# Patient Record
Sex: Male | Born: 1960
Health system: Southern US, Community
[De-identification: ages and names within clinical notes are randomized; demographics above are authoritative.]

## PROBLEM LIST (undated history)

## (undated) DIAGNOSIS — K635 Polyp of colon: Secondary | ICD-10-CM

## (undated) DIAGNOSIS — R7303 Prediabetes: Secondary | ICD-10-CM

## (undated) DIAGNOSIS — G4730 Sleep apnea, unspecified: Secondary | ICD-10-CM

## (undated) DIAGNOSIS — K760 Fatty (change of) liver, not elsewhere classified: Secondary | ICD-10-CM

## (undated) DIAGNOSIS — Z86010 Personal history of colonic polyps: Secondary | ICD-10-CM

## (undated) DIAGNOSIS — M1990 Unspecified osteoarthritis, unspecified site: Secondary | ICD-10-CM

## (undated) DIAGNOSIS — T8859XA Other complications of anesthesia, initial encounter: Secondary | ICD-10-CM

## (undated) HISTORY — PX: VASECTOMY: SHX75

## (undated) HISTORY — DX: Sleep apnea, unspecified: G47.30

## (undated) HISTORY — DX: Polyp of colon: K63.5

## (undated) HISTORY — DX: Fatty (change of) liver, not elsewhere classified: K76.0

## (undated) HISTORY — DX: Personal history of colonic polyps: Z86.010

---

## 1991-02-01 HISTORY — PX: TMJ ARTHROSCOPY: SHX1067

## 2010-02-17 ENCOUNTER — Encounter
Admission: RE | Admit: 2010-02-17 | Discharge: 2010-02-17 | Payer: Self-pay | Source: Home / Self Care | Attending: Internal Medicine | Admitting: Internal Medicine

## 2010-03-10 ENCOUNTER — Ambulatory Visit (HOSPITAL_BASED_OUTPATIENT_CLINIC_OR_DEPARTMENT_OTHER): Payer: BC Managed Care – PPO

## 2010-08-05 ENCOUNTER — Other Ambulatory Visit: Payer: Self-pay | Admitting: Internal Medicine

## 2010-08-05 DIAGNOSIS — M898X1 Other specified disorders of bone, shoulder: Secondary | ICD-10-CM

## 2010-08-06 ENCOUNTER — Ambulatory Visit
Admission: RE | Admit: 2010-08-06 | Discharge: 2010-08-06 | Disposition: A | Discharge status: Home / Self Care | Payer: BC Managed Care – PPO | Source: Ambulatory Visit | Attending: Internal Medicine | Admitting: Internal Medicine

## 2010-08-06 DIAGNOSIS — M898X1 Other specified disorders of bone, shoulder: Secondary | ICD-10-CM

## 2010-08-09 ENCOUNTER — Other Ambulatory Visit: Payer: Self-pay | Admitting: Internal Medicine

## 2010-08-09 DIAGNOSIS — K769 Liver disease, unspecified: Secondary | ICD-10-CM

## 2010-08-11 ENCOUNTER — Ambulatory Visit
Admission: RE | Admit: 2010-08-11 | Discharge: 2010-08-11 | Disposition: A | Discharge status: Home / Self Care | Payer: BC Managed Care – PPO | Source: Ambulatory Visit | Attending: Internal Medicine | Admitting: Internal Medicine

## 2010-08-11 DIAGNOSIS — K769 Liver disease, unspecified: Secondary | ICD-10-CM

## 2010-08-11 MED ORDER — IOHEXOL 300 MG/ML  SOLN
125.0000 mL | Freq: Once | INTRAMUSCULAR | Status: AC | PRN
  Administered 2010-08-11: 125 mL via INTRAVENOUS

## 2011-07-08 ENCOUNTER — Ambulatory Visit (INDEPENDENT_AMBULATORY_CARE_PROVIDER_SITE_OTHER): Payer: BC Managed Care – PPO | Admitting: Physician Assistant

## 2011-07-08 VITALS — BP 124/79 | HR 91 | Temp 98.9°F | Resp 16 | Ht 68.0 in | Wt 225.4 lb

## 2011-07-08 DIAGNOSIS — H9190 Unspecified hearing loss, unspecified ear: Secondary | ICD-10-CM

## 2011-07-08 DIAGNOSIS — H6990 Unspecified Eustachian tube disorder, unspecified ear: Secondary | ICD-10-CM

## 2011-07-08 DIAGNOSIS — H6120 Impacted cerumen, unspecified ear: Secondary | ICD-10-CM

## 2011-07-08 DIAGNOSIS — H6980 Other specified disorders of Eustachian tube, unspecified ear: Secondary | ICD-10-CM

## 2011-07-08 MED ORDER — PREDNISONE 20 MG PO TABS: ORAL_TABLET | ORAL | Status: AC

## 2011-07-08 NOTE — Progress Notes (Signed)
  Subjective:    Patient ID: Mitchell Lewis, male    DOB: May 28, 1960, 51 y.o.   MRN: [id number]  HPI Patient presents with progressively worsening hearing loss in both ears and tinnitus in the right ear. He states that he has a history of tinnitus in the right ear as well as cerumen impaction. He also complains of allergic rhinitis, ear pressure, and some sinus pressure. No fever, chills, otalgia, nausea, vomiting, or sore throat.     Review of Systems  All other systems reviewed and are negative.       Objective:   Physical Exam  Constitutional: He is oriented to person, place, and time. He appears well-developed and well-nourished.  HENT:  Head: Normocephalic and atraumatic.  Right Ear: External ear and ear canal normal.  Left Ear: External ear and ear canal normal. A middle ear effusion is present.  Mouth/Throat: Oropharynx is clear and moist. No oropharyngeal exudate.       Right cerumen impaction. Normal TM s/p ear irrigation. Left ear effusion present  Eyes: Conjunctivae are normal.  Neck: Neck supple.  Cardiovascular: Normal rate, regular rhythm and normal heart sounds.   Pulmonary/Chest: Effort normal and breath sounds normal.  Lymphadenopathy:    He has no cervical adenopathy.  Neurological: He is alert and oriented to person, place, and time.  Psychiatric: He has a normal mood and affect. His behavior is normal. Judgment and thought content normal.          Assessment & Plan:   1. Excessive ear wax  Ear care instructions provided.  EAR CERUMEN REMOVAL, EAR CERUMEN REMOVAL  2. Hearing loss  If no improvement after completion of steroid treatment, refer to ENT for evaluation (patient will wait to start treatment after colonoscopy scheduled for 07/15/11) Pure tone audiometry air only, Pure tone audiometry air only  3. Eustachian tube dysfunction  Recommend OTC claritin or zyrtec to help with effusion as well as allergic rhinitis.  predniSONE (DELTASONE) 20 MG tablet

## 2011-07-08 NOTE — Patient Instructions (Addendum)
Discontinue use of Q-tips, hair pins, keys, etc.   To help prevent cerumen impaction: While in the washing hair in the shower, allow soapy water to run into ear canal for a few minutes and then rinse (it dissolves the wax like a dishwasher dissolves grease).   May also use half hydrogen peroxide half water in the shower 2-3 times per week to help rinse out the wax. DO NOT USE 100% HYDROGEN PEROXIDE (this can burn the skin).   Also, several drops of sweet oil can be used to help prevent itching of the canal. If this is not enough to decrease itch, you may put a small amount of OTC hydrocortisone cream on pinky finger and apply to portion of canal that can be reached with tip of finger.  Recommend OTC Debrox or Colace to prevent impaction.  Serous Otitis Media  Serous otitis media is also known as otitis media with effusion (OME). It means there is fluid in the middle ear space. This space contains the bones for hearing and air. Air in the middle ear space helps to transmit sound.  The air gets there through the eustachian tube. This tube goes from the back of the throat to the middle ear space. It keeps the pressure in the middle ear the same as the outside world. It also helps to drain fluid from the middle ear space. CAUSES  OME occurs when the eustachian tube gets blocked. Blockage can come from:  Ear infections.   Colds and other upper respiratory infections.   Allergies.   Irritants such as cigarette smoke.   Sudden changes in air pressure (such as descending in an airplane).   Enlarged adenoids.  During colds and upper respiratory infections, the middle ear space can become temporarily filled with fluid. This can happen after an ear infection also. Once the infection clears, the fluid will generally drain out of the ear through the eustachian tube. If it does not, then OME occurs. SYMPTOMS   Hearing loss.   A feeling of fullness in the ear - but no pain.   Young children may not  show any symptoms.  DIAGNOSIS   Diagnosis of OME is made by an ear exam.   Tests may be done to check on the movement of the eardrum.   Hearing exams may be done.  TREATMENT   The fluid most often goes away without treatment.   If allergy is the cause, allergy treatment may be helpful.   Fluid that persists for several months may require minor surgery. A small tube is placed in the ear drum to:   Drain the fluid.   Restore the air in the middle ear space.   In certain situations, antibiotics are used to avoid surgery.   Surgery may be done to remove enlarged adenoids (if this is the cause).  HOME CARE INSTRUCTIONS   Keep children away from tobacco smoke.   Be sure to keep follow up appointments, if any.  SEEK MEDICAL CARE IF:   Hearing is not better in 3 months.   Hearing is worse.   Ear pain.   Drainage from the ear.   Dizziness.  Document Released: 04/09/2003 Document Revised: 01/06/2011 Document Reviewed: 02/07/2008 [facility] Patient Information 2012 [place], [place].

## 2011-11-06 IMAGING — CR DG LUMBAR SPINE COMPLETE 4+V
5 series · 5 of 5 positions shown · non-contrast
Comparison: None.

CLINICAL DATA: Low back pain with left leg radiculopathy.

LUMBAR SPINE - COMPLETE 4+ VIEW

[view not recorded (1 of 5)]
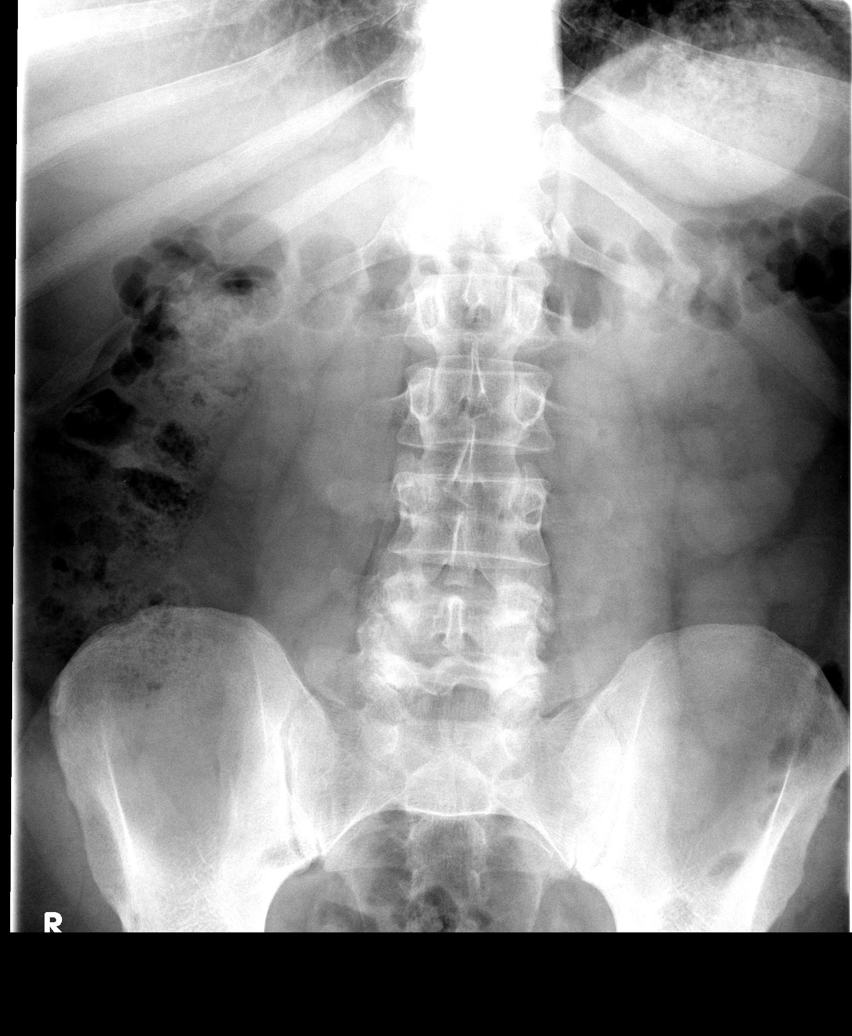

[view not recorded (2 of 5)]
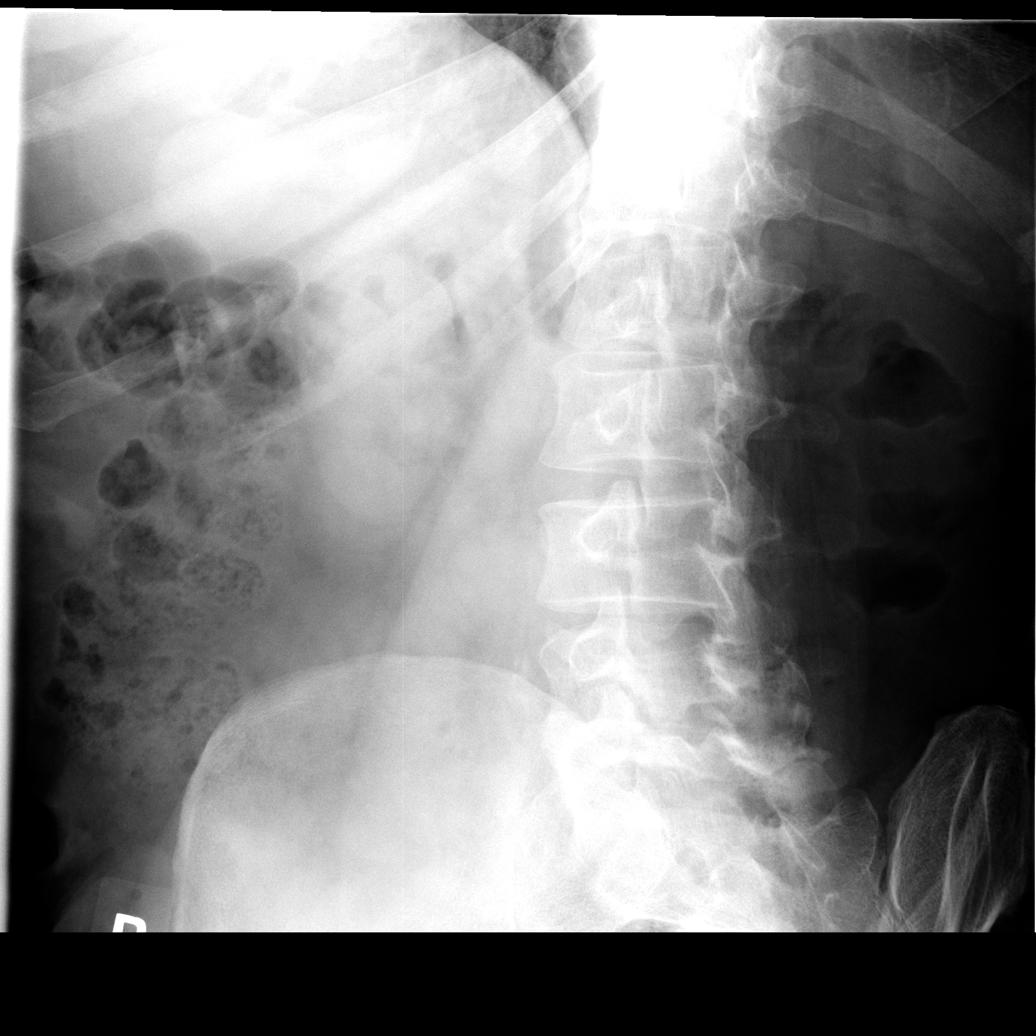

[view not recorded (3 of 5)]
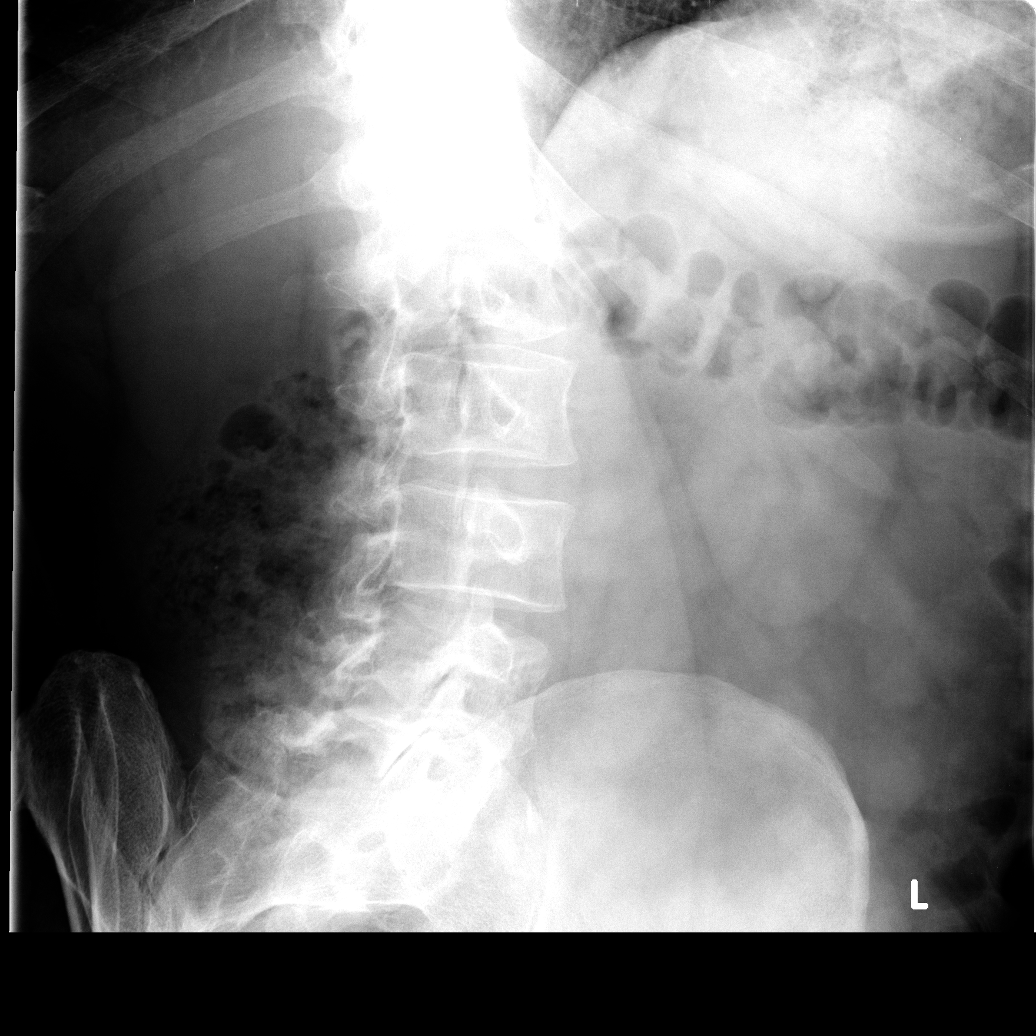

[view not recorded (4 of 5)]
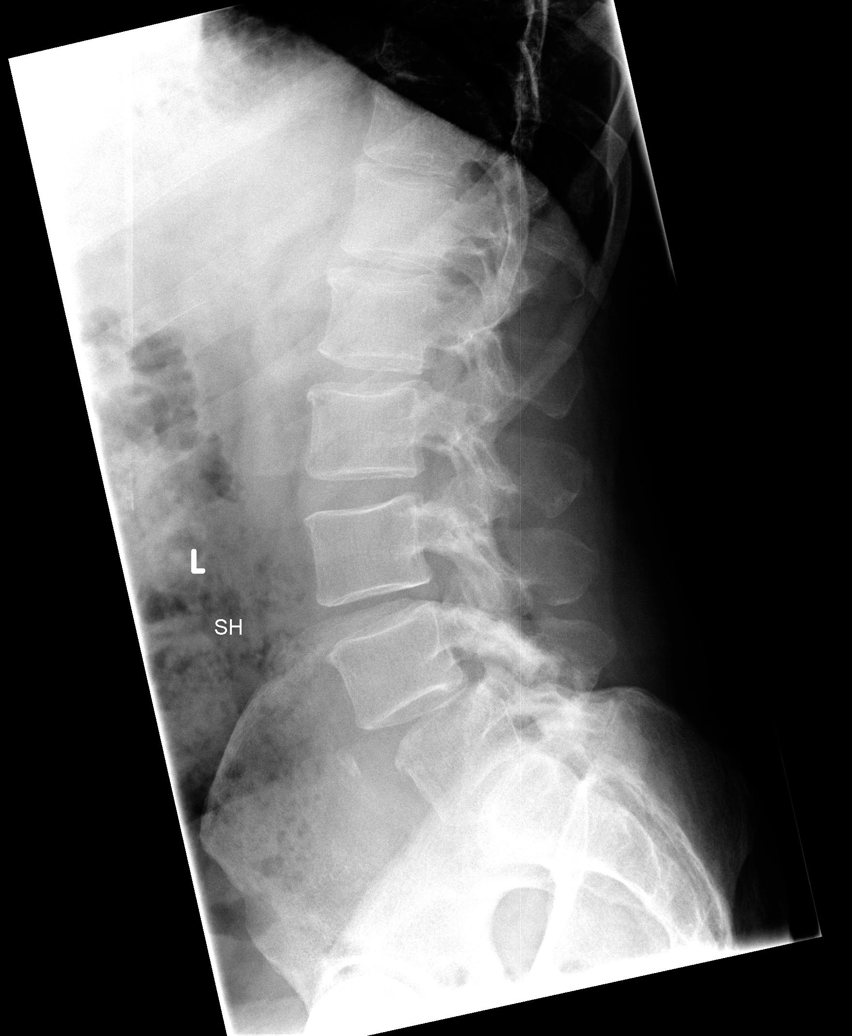

[view not recorded (5 of 5)]
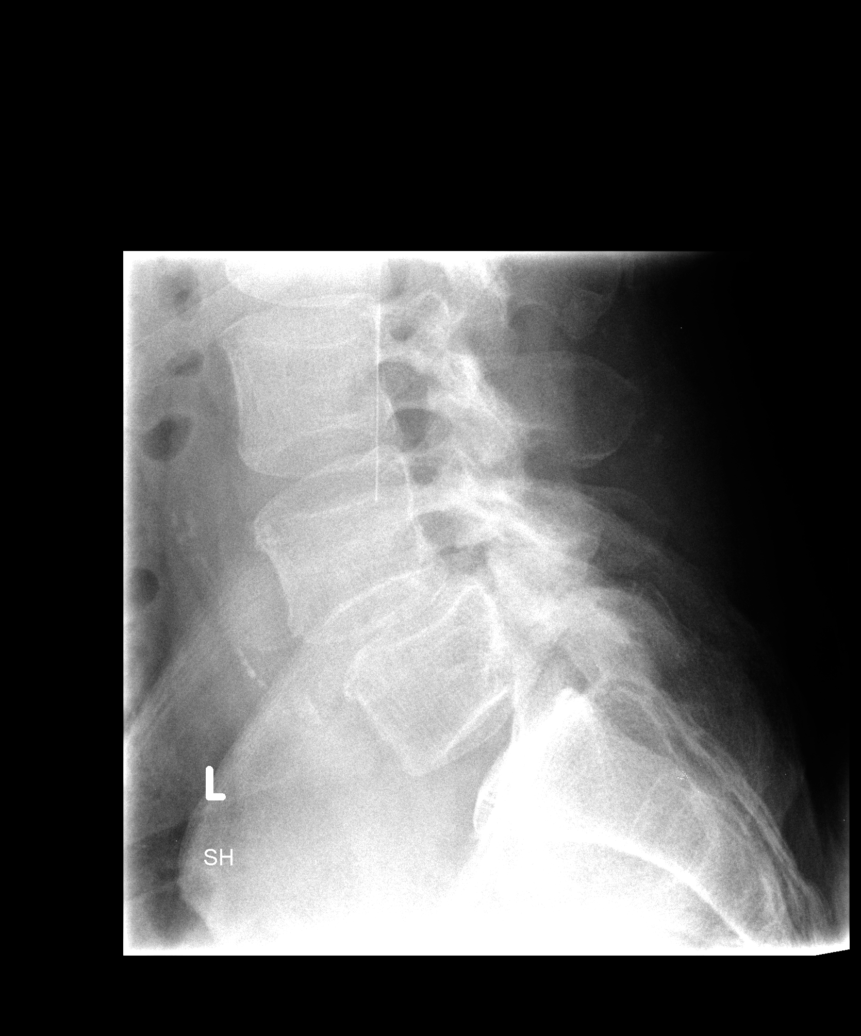

[5 of 5 positions shown; findings below may reference images not displayed]

FINDINGS: There is sclerosis of the L4 pars bilaterally.  An
associated lucency is difficult to confirm.  There is mild grade 1
anterolisthesis of L4 on L5.  There are mild endplate degenerative
changes scattered throughout the thoracolumbar spine.  Facet
hypertrophy in the lower lumbar spine.
IMPRESSION: 1.  Mild grade 1 anterolisthesis of L4 on L5.  Question chronic L5
pars defects.
2.  Spondylosis.

## 2012-01-18 ENCOUNTER — Ambulatory Visit
Admission: RE | Admit: 2012-01-18 | Discharge: 2012-01-18 | Disposition: A | Discharge status: Home / Self Care | Payer: BC Managed Care – PPO | Source: Ambulatory Visit | Attending: Nurse Practitioner | Admitting: Nurse Practitioner

## 2012-01-18 ENCOUNTER — Other Ambulatory Visit: Payer: Self-pay | Admitting: Nurse Practitioner

## 2012-01-18 DIAGNOSIS — R05 Cough: Secondary | ICD-10-CM

## 2012-01-18 DIAGNOSIS — R059 Cough, unspecified: Secondary | ICD-10-CM

## 2012-02-15 ENCOUNTER — Other Ambulatory Visit: Payer: Self-pay | Admitting: Nurse Practitioner

## 2012-02-15 ENCOUNTER — Ambulatory Visit
Admission: RE | Admit: 2012-02-15 | Discharge: 2012-02-15 | Disposition: A | Discharge status: Home / Self Care | Payer: BC Managed Care – PPO | Source: Ambulatory Visit | Attending: Nurse Practitioner | Admitting: Nurse Practitioner

## 2012-02-15 DIAGNOSIS — R05 Cough: Secondary | ICD-10-CM

## 2012-02-15 DIAGNOSIS — R52 Pain, unspecified: Secondary | ICD-10-CM

## 2012-02-15 DIAGNOSIS — R059 Cough, unspecified: Secondary | ICD-10-CM

## 2012-02-23 ENCOUNTER — Institutional Professional Consult (permissible substitution): Payer: BC Managed Care – PPO | Admitting: Internal Medicine

## 2012-04-24 IMAGING — US US ABDOMEN COMPLETE
1 series · 13 of 25 positions shown · non-contrast
Comparison: Abdominal radiographs - 02/17/2010

CLINICAL DATA: Scapular pain, evaluate for gallstones

ABDOMINAL ULTRASOUND COMPLETE

[Series 1: us abdomen complete · 0.32mm/px · 13 of 79 slices shown]
[im 1/79]
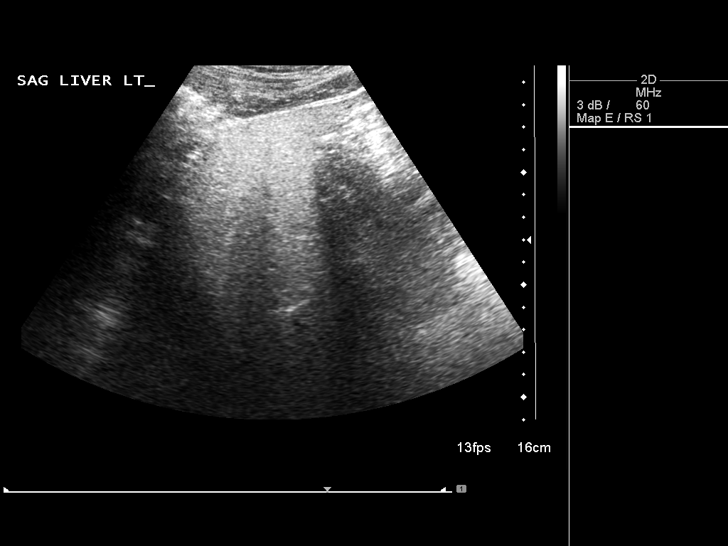
[im 7/79]
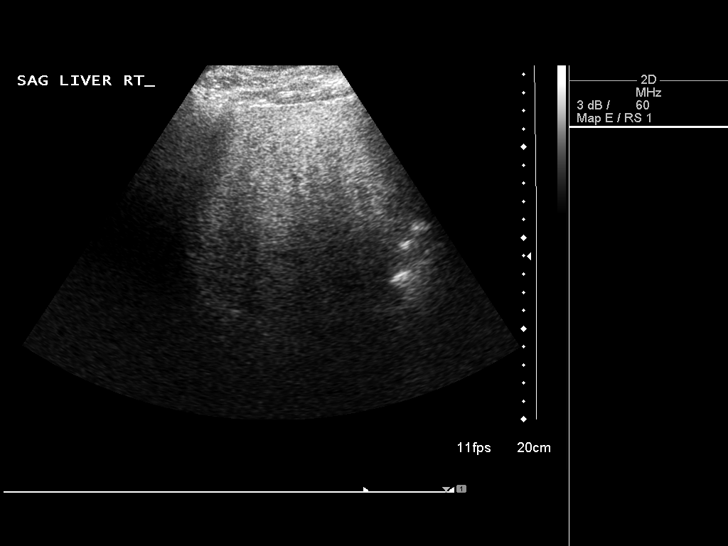
[im 14/79]
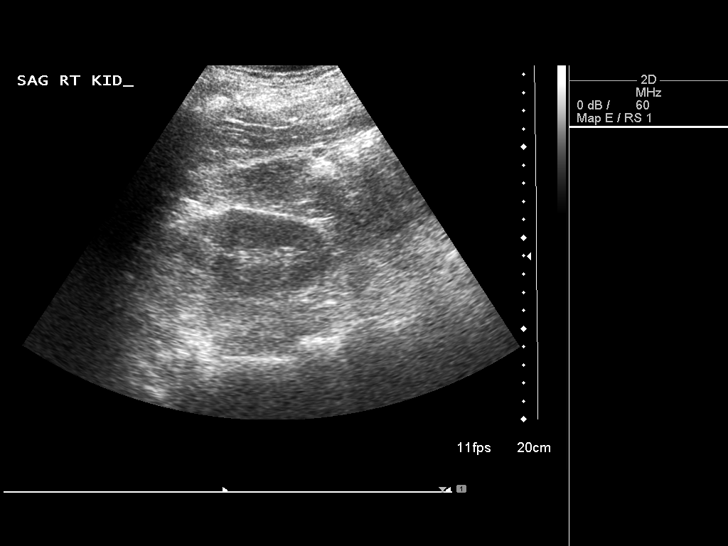
[im 20/79]
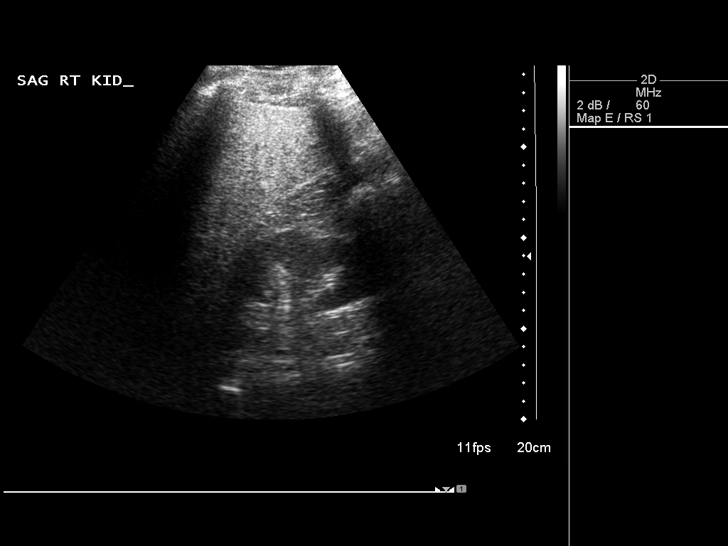
[im 27/79]
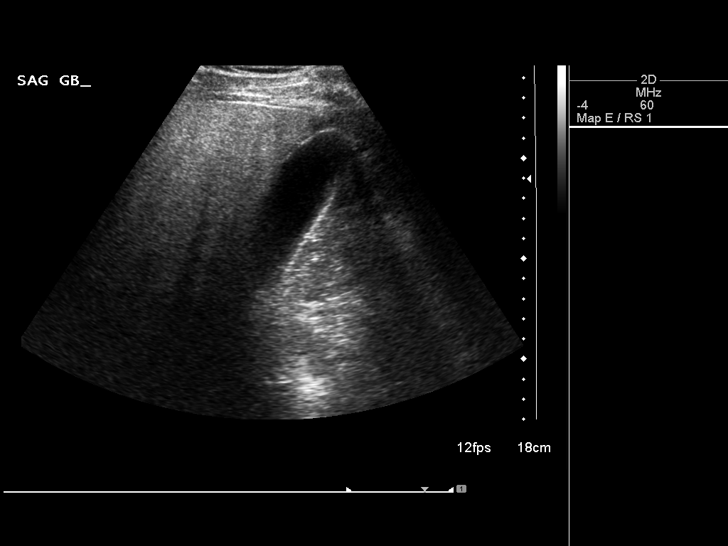
[im 33/79]
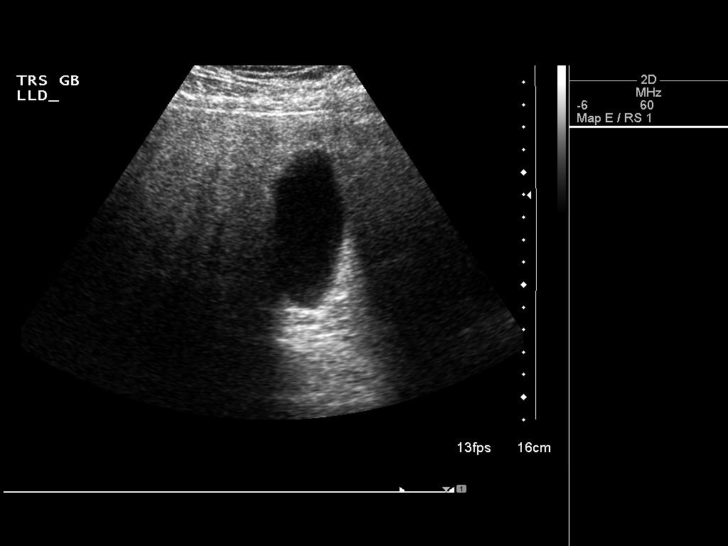
[im 40/79]
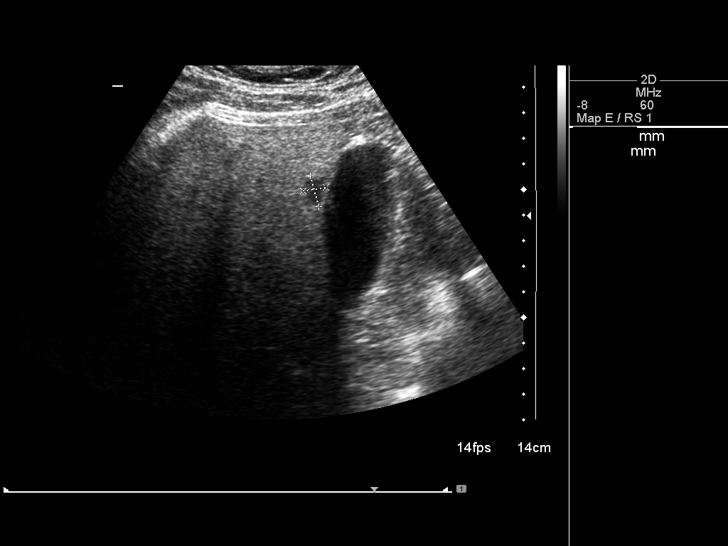
[im 46/79]
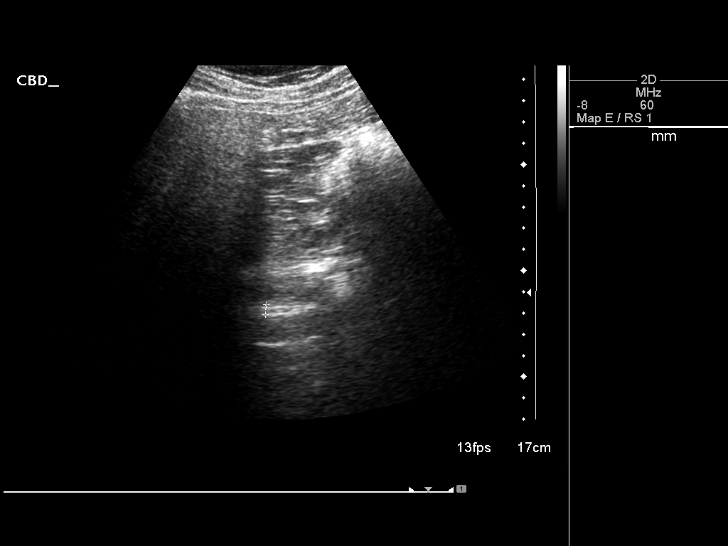
[im 53/79]
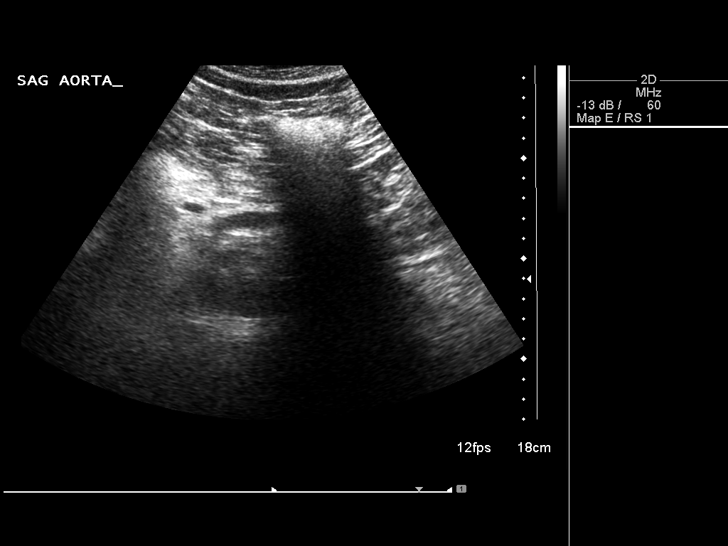
[im 59/79]
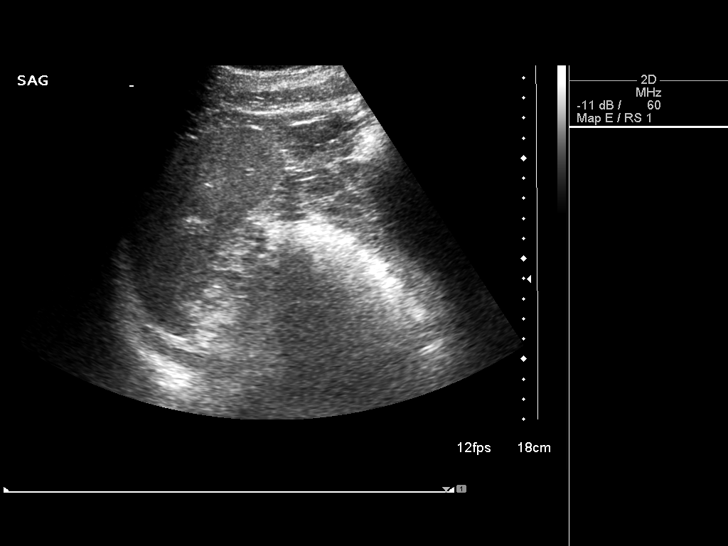
[im 66/79]
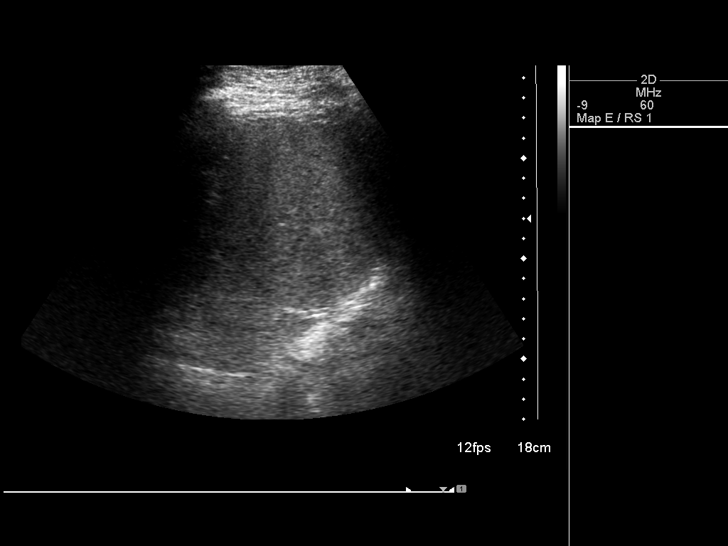
[im 72/79]
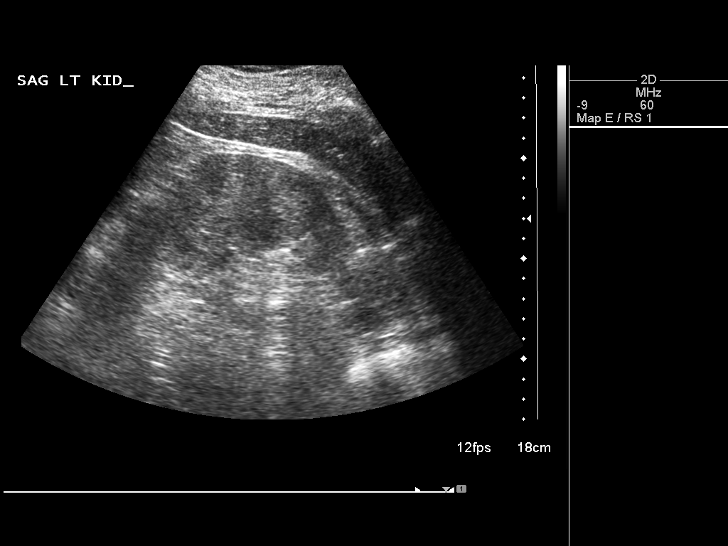
[im 79/79]
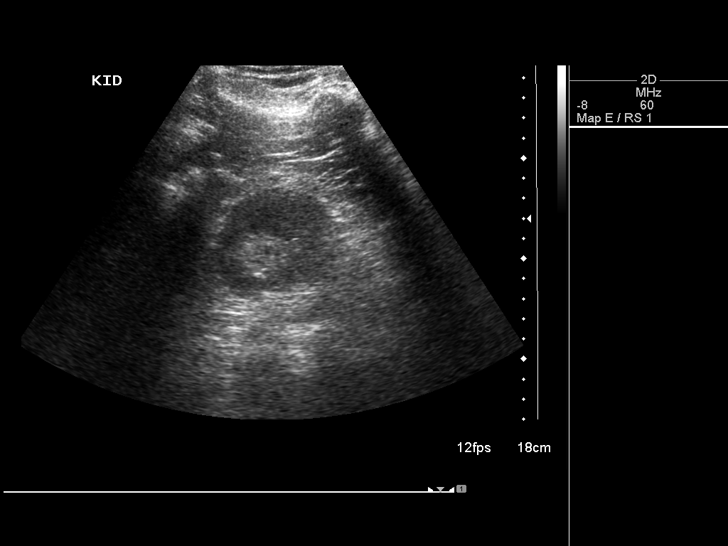

[13 of 25 positions shown; findings below may reference images not displayed]

FINDINGS: Gallbladder:  No gallstones, gallbladder wall thickening, or
pericholecystic fluid.

Common Bile Duct:  Normal in size, measuring 4.7 mm.

Liver: There are diffuse increased echogenicity of the hepatic
parenchyma, suggestive of hepatic steatosis.  There is an
approximately 1.3 x 1.2 x 0.9 cm hypoechoic mass adjacent to the
gallbladder fossa which does not demonstrate definite internal
blood flow, however cannot be characterized as a simple hepatic
cyst.

IVC:  Appears normal.

Pancreas:  Largely obscured by bowel gas.

Spleen:  Normal in size and echogenicity, measuring 9.8 cm in
length.

Right kidney:  The right kidney is normal in size, measuring
cm in length, cortical thickness and echogenicity.  No focal hyper
or hypoechoic renal lesions.  No echogenic renal stones.  No
urinary obstruction.

Left kidney:  The left kidney is normal in size, measuring 12.2 cm
in length, cortical thickness and echogenicity.  No focal hyper or
hypoechoic renal lesions.  No echogenic renal stones.  No urinary
obstruction.

Abdominal Aorta:  Nonaneurysmal.
IMPRESSION: 1. No evidence of cholelithiasis.

2. Hepatic steatosis.

3. Indeterminate 1.2 cm hypoechoic hepatic lesion adjacent to the
gallbladder fossa.  While this may represent an area of focal fatty
sparing, further evaluation may be obtained with an abdominal CT or
MRI. Alternatively, a follow-up abdominal ultrasound may be
performed in 3 to 6 months to insure stability.

## 2014-10-02 ENCOUNTER — Institutional Professional Consult (permissible substitution): Payer: Self-pay | Admitting: Neurology

## 2016-02-01 DIAGNOSIS — Z860100 Personal history of colon polyps, unspecified: Secondary | ICD-10-CM

## 2016-02-01 HISTORY — DX: Personal history of colonic polyps: Z86.010

## 2016-02-01 HISTORY — DX: Personal history of colon polyps, unspecified: Z86.0100

## 2017-01-09 HISTORY — PX: COLONOSCOPY: SHX174

## 2017-02-16 ENCOUNTER — Ambulatory Visit (INDEPENDENT_AMBULATORY_CARE_PROVIDER_SITE_OTHER): Payer: BLUE CROSS/BLUE SHIELD | Admitting: Family Medicine

## 2017-02-16 ENCOUNTER — Encounter: Payer: Self-pay | Admitting: Family Medicine

## 2017-02-16 ENCOUNTER — Other Ambulatory Visit: Payer: Self-pay

## 2017-02-16 VITALS — BP 128/90 | HR 91 | Temp 99.0°F | Resp 20 | Ht 68.0 in | Wt 292.2 lb

## 2017-02-16 DIAGNOSIS — B028 Zoster with other complications: Secondary | ICD-10-CM | POA: Diagnosis not present

## 2017-02-16 MED ORDER — VALACYCLOVIR HCL 1 G PO TABS: 1000.0000 mg | ORAL_TABLET | Freq: Three times a day (TID) | ORAL | 0 refills | Status: DC

## 2017-02-16 NOTE — Patient Instructions (Addendum)
You have an eye appointment today at [facility] at 2pm.  The address is:  [address] [place], [place] [address]     IF you received an x-ray today, you will receive an invoice from [facility]. Please contact [facility] at [phone] with questions or concerns regarding your invoice.   IF you received labwork today, you will receive an invoice from [place]. Please contact [facility] at [phone] with questions or concerns regarding your invoice.   Our billing staff will not be able to assist you with questions regarding bills from these companies.  You will be contacted with the lab results as soon as they are available. The fastest way to get your results is to activate your My Chart account. Instructions are located on the last page of this paperwork. If you have not heard from [place] regarding the results in 2 weeks, please contact this office.

## 2017-02-16 NOTE — Progress Notes (Signed)
   1/17/201912:30 PM  Mitchell Lewis 10-03-1960, 57 y.o. male [id number]  Chief Complaint  Patient presents with  . Rash    x3 days, left eye, pt states rash is starting to itch around the eye area and starting to blur his vision. Pt states it is not painful.    HPI:   Patient is a 57 y.o. male who presents today for 3 days of rash around left eyebrow. He states it started as a small collection of blisters with clear fluid. They have drained since then. Had some pressure in left ear but no burning pain. He states it is starting to itch. He is also starting to feel left eye pressure, blurry vision. He had chicken pox as a child.  Depression screen PHQ 2/9 02/16/2017  Decreased Interest 0  Down, Depressed, Hopeless 0  PHQ - 2 Score 0    Allergies  Allergen Reactions  . Sulfa Antibiotics Rash    Prior to Admission medications   Not on File    Past Medical History:  Diagnosis Date  . Colon polyp     Past Surgical History:  Procedure Laterality Date  . VASECTOMY      Social History   Tobacco Use  . Smoking status: Former Research scientist (life sciences)  . Smokeless tobacco: Never Used  Substance Use Topics  . Alcohol use: Yes    Alcohol/week: 0.6 oz    Types: 1 Standard drinks or equivalent per week    Family History  Problem Relation Age of Onset  . Diabetes Mother   . Cancer Father   . Heart disease Father     ROS  Per hpi   OBJECTIVE:  Blood pressure 128/90, pulse 91, temperature 99 F (37.2 C), temperature source Oral, resp. rate 20, height 5\' 8"  (1.727 m), weight 292 lb 3.2 oz (132.5 kg), SpO2 94 %.  Physical Exam Gen: AAOx3, NAD Skin: erythematous plaque non warm nor tender over left eyebrow covering down to upper left eyelid with a cluster of scabbed over vesicles in the middle. Ears: Left TM and canal are normal, no vesicles noted Eyes: EOMI and nonpainful, PERRLA, left conjunctiva mildly injected, negative fluorescin uptake   Visual Acuity Screening   Right eye  Left eye Both eyes  Without correction: 20/40 20/40 20/25   With correction:       ASSESSMENT and PLAN  1. Herpes zoster with complication Discussed supportive measures, new meds r/se/b and RTC precautions. Patient educational handout given. Patient has appt to be seen by ophtho today at 2pm, Dr Katy Fitch - Ambulatory referral to Ophthalmology  Other orders - valACYclovir (VALTREX) 1000 MG tablet; Take 1 tablet (1,000 mg total) by mouth 3 (three) times daily.  Return in about 1 week (around 02/23/2017).    Rutherford Guys, MD Primary Care at [facility] [place], [place] [address] Ph.  [phone] Fax [phone]

## 2017-02-21 ENCOUNTER — Telehealth: Payer: Self-pay | Admitting: Family Medicine

## 2017-02-21 NOTE — Telephone Encounter (Signed)
Sent to [facility] instead of [facility].

## 2017-03-01 ENCOUNTER — Encounter: Payer: Self-pay | Admitting: Family Medicine

## 2017-03-01 ENCOUNTER — Ambulatory Visit (INDEPENDENT_AMBULATORY_CARE_PROVIDER_SITE_OTHER): Payer: BLUE CROSS/BLUE SHIELD | Admitting: Family Medicine

## 2017-03-01 VITALS — BP 142/88 | HR 92 | Temp 98.6°F | Ht 67.75 in | Wt 298.4 lb

## 2017-03-01 DIAGNOSIS — R5382 Chronic fatigue, unspecified: Secondary | ICD-10-CM

## 2017-03-01 DIAGNOSIS — Z7689 Persons encountering health services in other specified circumstances: Secondary | ICD-10-CM | POA: Diagnosis not present

## 2017-03-01 DIAGNOSIS — Z125 Encounter for screening for malignant neoplasm of prostate: Secondary | ICD-10-CM

## 2017-03-01 DIAGNOSIS — M5442 Lumbago with sciatica, left side: Secondary | ICD-10-CM | POA: Diagnosis not present

## 2017-03-01 DIAGNOSIS — G4730 Sleep apnea, unspecified: Secondary | ICD-10-CM

## 2017-03-01 DIAGNOSIS — Z87898 Personal history of other specified conditions: Secondary | ICD-10-CM

## 2017-03-01 DIAGNOSIS — G8929 Other chronic pain: Secondary | ICD-10-CM

## 2017-03-01 DIAGNOSIS — Z6841 Body Mass Index (BMI) 40.0 and over, adult: Secondary | ICD-10-CM | POA: Diagnosis not present

## 2017-03-01 DIAGNOSIS — E66813 Obesity, class 3: Secondary | ICD-10-CM

## 2017-03-01 LAB — COMPREHENSIVE METABOLIC PANEL
ALBUMIN: 4.3 g/dL (ref 3.5–5.2)
ALT: 61 U/L — ABNORMAL HIGH (ref 0–53)
AST: 38 U/L — ABNORMAL HIGH (ref 0–37)
Alkaline Phosphatase: 79 U/L (ref 39–117)
BUN: 17 mg/dL (ref 6–23)
CALCIUM: 9.3 mg/dL (ref 8.4–10.5)
CHLORIDE: 103 meq/L (ref 96–112)
CO2: 30 meq/L (ref 19–32)
CREATININE: 1.05 mg/dL (ref 0.40–1.50)
GFR: 76.95 mL/min (ref >60.00)
Glucose, Bld: 116 mg/dL — ABNORMAL HIGH (ref 70–99)
POTASSIUM: 4.8 meq/L (ref 3.5–5.1)
Sodium: 138 mEq/L (ref 135–145)
Total Bilirubin: 0.9 mg/dL (ref 0.2–1.2)
Total Protein: 7.4 g/dL (ref 6.0–8.3)

## 2017-03-01 LAB — HEMOGLOBIN A1C: HEMOGLOBIN A1C: 6.3 % (ref 4.6–6.5)

## 2017-03-01 LAB — CBC WITH DIFFERENTIAL/PLATELET
Basophils Absolute: 0.1 10*3/uL (ref 0.0–0.1)
Basophils Relative: 1.2 % (ref 0.0–3.0)
EOS PCT: 3.1 % (ref 0.0–5.0)
Eosinophils Absolute: 0.2 10*3/uL (ref 0.0–0.7)
HEMATOCRIT: 49.5 % (ref 39.0–52.0)
HEMOGLOBIN: 17.1 g/dL — AB (ref 13.0–17.0)
LYMPHS PCT: 34.6 % (ref 12.0–46.0)
Lymphs Abs: 2.7 10*3/uL (ref 0.7–4.0)
MCHC: 34.6 g/dL (ref 30.0–36.0)
MCV: 96.5 fl (ref 78.0–100.0)
Monocytes Absolute: 1 10*3/uL (ref 0.1–1.0)
Monocytes Relative: 12.3 % — ABNORMAL HIGH (ref 3.0–12.0)
NEUTROS ABS: 3.9 10*3/uL (ref 1.4–7.7)
Neutrophils Relative %: 48.8 % (ref 43.0–77.0)
PLATELETS: 253 10*3/uL (ref 150.0–400.0)
RBC: 5.13 Mil/uL (ref 4.22–5.81)
RDW: 13.7 % (ref 11.5–15.5)
WBC: 7.9 10*3/uL (ref 4.0–10.5)

## 2017-03-01 LAB — PSA: PSA: 2.09 ng/mL (ref 0.10–4.00)

## 2017-03-01 LAB — TESTOSTERONE: TESTOSTERONE: 169.3 ng/dL — AB (ref 300.00–890.00)

## 2017-03-01 MED ORDER — MELOXICAM 15 MG PO TABS: 15.0000 mg | ORAL_TABLET | Freq: Every day | ORAL | 1 refills | Status: DC | PRN

## 2017-03-01 NOTE — Progress Notes (Addendum)
Subjective:    Patient ID: Mitchell Lewis, male    DOB: 06/25/1960, 57 y.o.   MRN: [id number]  HPI This is a 57 yo male who presents today to establish care. Is accompanied by his wife. Was seeing Delia Chimes in [place]- her office closed and he is unable to get his records.  Works as Barista. Enjoys fishing, hunting, has dogs x 3. Got married last year to his HS sweetheart. Has two sons.   Last CPE- about a year ago PSA- thinks about 1 year ago.  Colonoscopy- Dr. Maurice Small, has frequent colonoscopies due to multiple polyps, next due in 2 years.  Tdap- unsure Flu- never Dental- not regular Eye- annual, had recently Exercise- walks a lot, works outdoors every day  Obesity- Weight has gone from 175- to 298. Has symptoms of OSA, was unable to get testing for OSA due to insurance not covering.  Food recall-  Breakfast- 2 eggs, breakfast meat (1 piece bacon or sausage) Lunch- tuna salad, small amount of pasta salad with vegetables. Dinner- small amount lean protein, steamed veggies, sweet potato  No soda, no sweet tea, rare juice.   Fatigue- Has no energy. Is able to perform at work, then comes home exhausted. Currently not working much because this is his slow time of year.   Cardiology- Has had multiple stress tests (nuclear and stress), extensive work up Dr. Einar Gip. Normal per patient.   Hypogonadism- Had low testosterone levels and had testosterone injections for about 4 months without improvement. Has not seen urology or endocrine.   Low back pain- long lasting, across low back, pain/numbness down legs, worse as he walks more, stiff in the morning. No weakness. No bowel or bladder incontinence.   Past Medical History:  Diagnosis Date  . Colon polyp   . Fatty liver    Past Surgical History:  Procedure Laterality Date  . COLONOSCOPY  01/09/2017  . TMJ ARTHROSCOPY    . VASECTOMY     Family History  Problem Relation Age of Onset  . Diabetes Mother     . Cancer Father   . Heart disease Father    Social History   Tobacco Use  . Smoking status: Former Smoker    Packs/day: 1.00    Years: 40.00    Pack years: 40.00    Types: Cigarettes    Last attempt to quit: 05/15/2013    Years since quitting: 3.7  . Smokeless tobacco: Never Used  Substance Use Topics  . Alcohol use: Yes    Alcohol/week: 0.6 oz    Types: 1 Standard drinks or equivalent per week  . Drug use: No      Review of Systems  Constitutional: Positive for fatigue and unexpected weight change (increased). Negative for fever.  Eyes: Negative.   Respiratory: Positive for shortness of breath (gets winded with exertion since weight gain). Negative for cough, chest tightness and wheezing.   Cardiovascular: Negative for chest pain, palpitations and leg swelling.  Gastrointestinal: Negative for abdominal pain, constipation, diarrhea, nausea and vomiting.  Endocrine: Negative.   Genitourinary:       Stream not as strong as when he was younger.   Musculoskeletal: Positive for back pain.  Psychiatric/Behavioral: Positive for sleep disturbance (snoring, witnessed apnea).       Objective:   Physical Exam Physical Exam  Constitutional: Oriented to person, place, and time. He appears well-developed and well-nourished. Obese.  HENT:  Head: Normocephalic and atraumatic.  Eyes: Conjunctivae are normal.  Neck:  Normal range of motion. Neck supple.  Cardiovascular: Normal rate, regular rhythm and normal heart sounds.   Pulmonary/Chest: Effort normal and breath sounds normal.  Neurological: Alert and oriented to person, place, and time.  Skin: Skin is warm and dry.  Psychiatric: Normal mood and affect. Behavior is normal. Judgment and thought content normal.  Vitals reviewed.  BP (!) 142/88 (BP Location: Left Arm, Patient Position: Sitting, Cuff Size: Large)   Pulse 92   Temp 98.6 F (37 C) (Oral)   Ht 5' 7.75" (1.721 m)   Wt 298 lb 6.4 oz (135.4 kg)   SpO2 96%   BMI  45.71 kg/m      Depression screen [facility] 2/9 03/01/2017 02/16/2017  Decreased Interest 1 0  Down, Depressed, Hopeless 1 0  PHQ - 2 Score 2 0  Altered sleeping 2 -  Tired, decreased energy 2 -  Change in appetite 1 -  Feeling bad or failure about yourself  0 -  Trouble concentrating 0 -  Moving slowly or fidgety/restless 0 -  Suicidal thoughts 0 -  PHQ-9 Score 7 -  Difficult doing work/chores Somewhat difficult -    Assessment & Plan:  1. Encounter to establish care - will get available records - follow up in 4-8 weeks for CPE  2. BMI 45.0-49.9, adult (HCC) - CBC with Differential - Comprehensive metabolic panel - Hemoglobin A1c  3. Chronic bilateral low back pain with left-sided sciatica - Provided written and verbal information regarding diagnosis and treatment. - provided exercises and encouraged him to do daily - meloxicam (MOBIC) 15 MG tablet; Take 1 tablet (15 mg total) by mouth daily as needed for pain.  Dispense: 30 tablet; Refill: 1  4. Chronic fatigue - CBC with Differential - Comprehensive metabolic panel - Testosterone  5. History of snoring - Ambulatory referral to Pulmonology  6. Sleep apnea, unspecified type - witnessed episodes of apnea - Ambulatory referral to Pulmonology  7. Screening for prostate cancer - PSA   Clarene Reamer, FNP-BC  [facility]  03/01/2017 12:49 PM

## 2017-03-01 NOTE — Patient Instructions (Signed)
It was a pleasure to meet you today! I look forward to partnering with you for your health care needs  Please schedule your complete physical for 4-8 weeks   Back Exercises The following exercises strengthen the muscles that help to support the back. They also help to keep the lower back flexible. Doing these exercises can help to prevent back pain or lessen existing pain. If you have back pain or discomfort, try doing these exercises 2-3 times each day or as told by your health care provider. When the pain goes away, do them once each day, but increase the number of times that you repeat the steps for each exercise (do more repetitions). If you do not have back pain or discomfort, do these exercises once each day or as told by your health care provider. Exercises Single Knee to Chest  Repeat these steps 3-5 times for each leg: 1. Lie on your back on a firm bed or the floor with your legs extended. 2. Bring one knee to your chest. Your other leg should stay extended and in contact with the floor. 3. Hold your knee in place by grabbing your knee or thigh. 4. Pull on your knee until you feel a gentle stretch in your lower back. 5. Hold the stretch for 10-30 seconds. 6. Slowly release and straighten your leg.  Pelvic Tilt  Repeat these steps 5-10 times: 1. Lie on your back on a firm bed or the floor with your legs extended. 2. Bend your knees so they are pointing toward the ceiling and your feet are flat on the floor. 3. Tighten your lower abdominal muscles to press your lower back against the floor. This motion will tilt your pelvis so your tailbone points up toward the ceiling instead of pointing to your feet or the floor. 4. With gentle tension and even breathing, hold this position for 5-10 seconds.  Cat-Cow  Repeat these steps until your lower back becomes more flexible: 1. Get into a hands-and-knees position on a firm surface. Keep your hands under your shoulders, and keep your knees  under your hips. You may place padding under your knees for comfort. 2. Let your head hang down, and point your tailbone toward the floor so your lower back becomes rounded like the back of a cat. 3. Hold this position for 5 seconds. 4. Slowly lift your head and point your tailbone up toward the ceiling so your back forms a sagging arch like the back of a cow. 5. Hold this position for 5 seconds.  Press-Ups  Repeat these steps 5-10 times: 1. Lie on your abdomen (face-down) on the floor. 2. Place your palms near your head, about shoulder-width apart. 3. While you keep your back as relaxed as possible and keep your hips on the floor, slowly straighten your arms to raise the top half of your body and lift your shoulders. Do not use your back muscles to raise your upper torso. You may adjust the placement of your hands to make yourself more comfortable. 4. Hold this position for 5 seconds while you keep your back relaxed. 5. Slowly return to lying flat on the floor.  Bridges  Repeat these steps 10 times: 1. Lie on your back on a firm surface. 2. Bend your knees so they are pointing toward the ceiling and your feet are flat on the floor. 3. Tighten your buttocks muscles and lift your buttocks off of the floor until your waist is at almost the same height as  your knees. You should feel the muscles working in your buttocks and the back of your thighs. If you do not feel these muscles, slide your feet 1-2 inches farther away from your buttocks. 4. Hold this position for 3-5 seconds. 5. Slowly lower your hips to the starting position, and allow your buttocks muscles to relax completely.  If this exercise is too easy, try doing it with your arms crossed over your chest. Abdominal Crunches  Repeat these steps 5-10 times: 1. Lie on your back on a firm bed or the floor with your legs extended. 2. Bend your knees so they are pointing toward the ceiling and your feet are flat on the floor. 3. Cross  your arms over your chest. 4. Tip your chin slightly toward your chest without bending your neck. 5. Tighten your abdominal muscles and slowly raise your trunk (torso) high enough to lift your shoulder blades a tiny bit off of the floor. Avoid raising your torso higher than that, because it can put too much stress on your low back and it does not help to strengthen your abdominal muscles. 6. Slowly return to your starting position.  Back Lifts Repeat these steps 5-10 times: 1. Lie on your abdomen (face-down) with your arms at your sides, and rest your forehead on the floor. 2. Tighten the muscles in your legs and your buttocks. 3. Slowly lift your chest off of the floor while you keep your hips pressed to the floor. Keep the back of your head in line with the curve in your back. Your eyes should be looking at the floor. 4. Hold this position for 3-5 seconds. 5. Slowly return to your starting position.  Contact a health care provider if:  Your back pain or discomfort gets much worse when you do an exercise.  Your back pain or discomfort does not lessen within 2 hours after you exercise. If you have any of these problems, stop doing these exercises right away. Do not do them again unless your health care provider says that you can. Get help right away if:  You develop sudden, severe back pain. If this happens, stop doing the exercises right away. Do not do them again unless your health care provider says that you can. This information is not intended to replace advice given to you by your health care provider. Make sure you discuss any questions you have with your health care provider. Document Released: 02/25/2004 Document Revised: 05/27/2015 Document Reviewed: 03/13/2014 Elsevier Interactive Patient Education  2017 [employer].

## 2017-03-02 ENCOUNTER — Encounter: Payer: Self-pay | Admitting: Internal Medicine

## 2017-03-02 ENCOUNTER — Other Ambulatory Visit: Payer: Self-pay | Admitting: Family Medicine

## 2017-03-02 ENCOUNTER — Ambulatory Visit (INDEPENDENT_AMBULATORY_CARE_PROVIDER_SITE_OTHER): Payer: BLUE CROSS/BLUE SHIELD | Admitting: Internal Medicine

## 2017-03-02 VITALS — BP 138/88 | HR 75 | Resp 16 | Ht 67.5 in | Wt 297.0 lb

## 2017-03-02 DIAGNOSIS — G4719 Other hypersomnia: Secondary | ICD-10-CM | POA: Diagnosis not present

## 2017-03-02 DIAGNOSIS — R7989 Other specified abnormal findings of blood chemistry: Secondary | ICD-10-CM

## 2017-03-02 NOTE — Patient Instructions (Signed)
--

## 2017-03-02 NOTE — Progress Notes (Signed)
[facility] Consultation      Assessment and Plan:  Excessive daytime sleepiness.       Date: 03/02/2017  MRN# [id number] Mitchell Lewis 04/25/1960  Referring Physician:   [name] is a 57 y.o. old male seen in consultation for chief complaint of:    Chief Complaint  Patient presents with  . Advice Only    referred by Dr. Carlean Purl for evaluation of sleep apnea.  . Snoring    spouse reports apnea.    HPI:   Patient is a 57 year old male with a complaint of excessive daytime sleepiness.  The patient typically goes to bed between 8 and 10 PM, wakes up frequently during the night, gets out of bed at 5 AM.  His Epworth scale is elevated today at 15. His wife is present and provides some of the history. He has been snoring, for about 3 years  Since he gained about 130 pounds over 3 years, he quit smoking around that time.  He stops breathing in his sleep, his wife wakes him because he is not breathing and it is making you scared.  Lewis sleep walking, Lewis cataplexy. He does fall asleep easily when he is sitting, especially after lunch.  He wakes himself up several times.    PMHX:   Past Medical History:  Diagnosis Date  . Colon polyp   . Fatty liver    Surgical Hx:  Past Surgical History:  Procedure Laterality Date  . COLONOSCOPY  01/09/2017  . TMJ ARTHROSCOPY    . VASECTOMY     Family Hx:  Family History  Problem Relation Age of Onset  . Diabetes Mother   . Cancer Father   . Heart disease Father    Social Hx:   Social History   Tobacco Use  . Smoking status: Former Smoker    Packs/day: 1.00    Years: 40.00    Pack years: 40.00    Types: Cigarettes    Last attempt to quit: 05/15/2013    Years since quitting: 3.8  . Smokeless tobacco: Never Used  Substance Use Topics  . Alcohol use: Yes    Alcohol/week: 0.6 oz    Types: 1 Standard drinks or equivalent per week  . Drug use: Lewis   Medication:    Current Outpatient Medications:  .   meloxicam (MOBIC) 15 MG tablet, Take 1 tablet (15 mg total) by mouth daily as needed for pain., Disp: 30 tablet, Rfl: 1 .  TURMERIC PO, Take 1 tablet by mouth daily., Disp: , Rfl:  .  UNABLE TO FIND, Med Name: Super Beads, Disp: , Rfl:    Allergies:  Sulfa antibiotics  Review of Systems: Gen:  Denies  fever, sweats, chills HEENT: Denies blurred vision, double vision. bleeds, sore throat Cvc:  Lewis dizziness, chest pain. Resp:   Denies cough or sputum production, shortness of breath Gi: Denies swallowing difficulty, stomach pain. Gu:  Denies bladder incontinence, burning urine Ext:   Lewis Joint pain, stiffness. Skin: Lewis skin rash,  hives  Endoc:  Lewis polyuria, polydipsia. Psych: Lewis depression, insomnia. Other:  All other systems were reviewed with the patient and were negative other that what is mentioned in the HPI.   Physical Examination:   VS: BP 138/88 (BP Location: Left Arm, Cuff Size: Large)   Pulse 75   Resp 16   Ht 5' 7.5" (1.715 m)   Wt 297 lb (134.7 kg)   SpO2 94%   BMI 45.83 kg/m  General Appearance: Lewis distress  Neuro:without focal findings,  speech normal,  HEENT: PERRLA, EOM intact.   Pulmonary: normal breath sounds, Lewis wheezing.  CardiovascularNormal S1,S2.  Lewis m/r/g.   Abdomen: Benign, Soft, non-tender. Renal:  Lewis costovertebral tenderness  GU:  Lewis performed at this time. Endoc: Lewis evident thyromegaly, Lewis signs of acromegaly. Skin:   warm, Lewis rashes, Lewis ecchymosis  Extremities: normal, Lewis cyanosis, clubbing.  Other findings:    LABORATORY PANEL:   CBC Recent Labs  Lab 03/01/17 1001  WBC 7.9  HGB 17.1*  HCT 49.5  PLT 253.0   ------------------------------------------------------------------------------------------------------------------  Chemistries  Recent Labs  Lab 03/01/17 1001  NA 138  K 4.8  CL 103  CO2 30  GLUCOSE 116*  BUN 17  CREATININE 1.05  CALCIUM 9.3  AST 38*  ALT 61*  ALKPHOS 79  BILITOT 0.9    ------------------------------------------------------------------------------------------------------------------  Cardiac Enzymes Lewis results for input(s): TROPONINI in the last 168 hours. ------------------------------------------------------------  RADIOLOGY:  Lewis results found.     Thank  you for the consultation and for allowing [facility] to assist in the care of your patient. Our recommendations are noted above.  Please contact us if we can be of further service.   Marda Stalker, MD.  Board Certified in Internal Medicine, Pulmonary Medicine, [place], and Sleep Medicine.  [facility] Office Number: [phone]  Patricia Pesa, M.D.  Merton Border, M.D  03/02/2017

## 2017-03-03 ENCOUNTER — Institutional Professional Consult (permissible substitution): Payer: BLUE CROSS/BLUE SHIELD | Admitting: Internal Medicine

## 2017-03-09 ENCOUNTER — Encounter: Payer: Self-pay | Admitting: Family Medicine

## 2017-03-17 HISTORY — PX: WISDOM TOOTH EXTRACTION: SHX21

## 2017-03-20 ENCOUNTER — Telehealth: Payer: Self-pay | Admitting: Internal Medicine

## 2017-03-20 NOTE — Telephone Encounter (Signed)
Pt is due to go out of town on Sunday 03/26/17.  Offered 03/24/17 but patient is having a dental procedure on Wednesday 03/22/17.  Have left a message to see if I can get patient scheduled for 03/21/17 to change appointments with this patient and can get him scheduled for 03/21/17.  Have attempted to contact patient back to advise what I was attempting to arrange, however, no answer and no voice mail has been set up.  Rhonda J Cobb

## 2017-03-20 NOTE — Telephone Encounter (Signed)
Please call regarding pt sleep study , to be scheduled

## 2017-03-21 ENCOUNTER — Encounter: Payer: Self-pay | Admitting: Internal Medicine

## 2017-03-21 DIAGNOSIS — G4733 Obstructive sleep apnea (adult) (pediatric): Secondary | ICD-10-CM | POA: Diagnosis not present

## 2017-03-21 DIAGNOSIS — G4719 Other hypersomnia: Secondary | ICD-10-CM

## 2017-03-21 NOTE — Telephone Encounter (Signed)
Pt is coming to p/u the HST device Tues 03/21/17 between 3:30-4:30.  Pt is aware of this appointment and will be able to have study before any procedure. Nothing else needed at this time. Rhonda J Cobb

## 2017-03-22 ENCOUNTER — Telehealth: Payer: Self-pay | Admitting: *Deleted

## 2017-03-22 DIAGNOSIS — G4733 Obstructive sleep apnea (adult) (pediatric): Secondary | ICD-10-CM | POA: Diagnosis not present

## 2017-03-22 NOTE — Telephone Encounter (Signed)
Called patient to give results he has had dental surgery therefore spouse put him on speaker. Patient was given results of HST and orders for titration study have been placed. Nothing further needed.

## 2017-03-27 ENCOUNTER — Ambulatory Visit (INDEPENDENT_AMBULATORY_CARE_PROVIDER_SITE_OTHER): Payer: BLUE CROSS/BLUE SHIELD | Admitting: Urology

## 2017-03-27 ENCOUNTER — Encounter: Payer: Self-pay | Admitting: Urology

## 2017-03-27 VITALS — BP 154/91 | HR 67 | Ht 67.0 in | Wt 289.0 lb

## 2017-03-27 DIAGNOSIS — R351 Nocturia: Secondary | ICD-10-CM | POA: Diagnosis not present

## 2017-03-27 DIAGNOSIS — N401 Enlarged prostate with lower urinary tract symptoms: Secondary | ICD-10-CM | POA: Insufficient documentation

## 2017-03-27 DIAGNOSIS — E291 Testicular hypofunction: Secondary | ICD-10-CM | POA: Insufficient documentation

## 2017-03-27 DIAGNOSIS — R350 Frequency of micturition: Secondary | ICD-10-CM | POA: Diagnosis not present

## 2017-03-27 LAB — URINALYSIS, COMPLETE
BILIRUBIN UA: NEGATIVE
Glucose, UA: NEGATIVE
LEUKOCYTES UA: NEGATIVE
Nitrite, UA: NEGATIVE
PROTEIN UA: NEGATIVE
RBC UA: NEGATIVE
SPEC GRAV UA: 1.025 (ref 1.005–1.030)
Urobilinogen, Ur: 1 mg/dL (ref 0.2–1.0)
pH, UA: 6 (ref 5.0–7.5)

## 2017-03-27 LAB — MICROSCOPIC EXAMINATION
RBC, UA: NONE SEEN /hpf (ref 0–?)
WBC, UA: NONE SEEN /hpf (ref 0–?)

## 2017-03-27 LAB — BLADDER SCAN AMB NON-IMAGING: Scan Result: 37

## 2017-03-27 MED ORDER — TAMSULOSIN HCL 0.4 MG PO CAPS: 0.4000 mg | ORAL_CAPSULE | Freq: Every day | ORAL | 0 refills | Status: DC

## 2017-03-27 NOTE — Progress Notes (Signed)
03/27/2017 10:07 AM   Mitchell Lewis 1960/08/18 [id number]  Referring provider: Elby Beck, [place], [place] [address]  Chief Complaint  Patient presents with  . Urinary Frequency    HPI: Mitchell Lewis is a 57 year old male seen in consultation at the request of Mitchell Lewis for evaluation of hypogonadism.  He was seen by his PCP in January 2019 to establish care and related to chronic fatigue and tiredness.  He does have suspected sleep apnea and is awaiting an appointment by pulmonary.  A testosterone level was checked which was low at 169 ng/dL. A PSA was 2.09.  He denies decreased libido  or erectile dysfunction.  He states his symptoms of tiredness and fatigue started after a significant weight gain approximately 1-1/2 years ago.  His wife states this was when he also began to snore with apparent apneic episodes.  He also complains of bothersome nocturia at 3 times per night.  He also has urinary frequency with occasional urgency and and intermittent urinary stream with occasional straining to urinate.  He has been seen by Dr. Terance Hart in [place] several years ago for a vasectomy and for scrotal swelling after a bee sting.  PMH: Past Medical History:  Diagnosis Date  . Colon polyp   . Fatty liver   . History of colon polyps 2018  . Sleep apnea     Surgical History: Past Surgical History:  Procedure Laterality Date  . COLONOSCOPY  01/09/2017  . TMJ ARTHROSCOPY  1993  . VASECTOMY    . WISDOM TOOTH EXTRACTION  03/17/2017    Home Medications:  Allergies as of 03/27/2017      Reactions   Sulfa Antibiotics Rash, Hives      Medication List        Accurate as of 03/27/17 10:07 AM. Always use your most recent med list.          amoxicillin 875 MG tablet Commonly known as:  AMOXIL   meloxicam 15 MG tablet Commonly known as:  MOBIC Take 1 tablet (15 mg total) by mouth daily as needed for pain.   multivitamin tablet Take 1  tablet by mouth daily.       Allergies:  Allergies  Allergen Reactions  . Sulfa Antibiotics Rash and Hives    Family History: Family History  Problem Relation Age of Onset  . Diabetes Mother   . Cancer Father   . Heart disease Father   . Kidney failure Father   . Prostate cancer Paternal Grandfather     Social History:  reports that he quit smoking about 3 years ago. His smoking use included cigarettes. He has a 40.00 pack-year smoking history. he has never used smokeless tobacco. He reports that he drinks about 0.6 oz of alcohol per week. He reports that he does not use drugs.  ROS: UROLOGY Frequent Urination?: Yes Hard to postpone urination?: Yes Burning/pain with urination?: No Get up at night to urinate?: Yes Leakage of urine?: No Urine stream starts and stops?: Yes Trouble starting stream?: No Do you have to strain to urinate?: Yes Blood in urine?: No Urinary tract infection?: No Sexually transmitted disease?: No Injury to kidneys or bladder?: No Painful intercourse?: No Weak stream?: Yes Erection problems?: No Penile pain?: No  Gastrointestinal Nausea?: No Vomiting?: No Indigestion/heartburn?: No Diarrhea?: No Constipation?: Yes  Constitutional Fever: Yes Night sweats?: Yes Weight loss?: No Fatigue?: Yes  Skin Skin rash/lesions?: No Itching?: No  Eyes Blurred vision?: No  Double vision?: No  Ears/Nose/Throat Sore throat?: No Sinus problems?: No  Hematologic/Lymphatic Swollen glands?: No Easy bruising?: No  Cardiovascular Leg swelling?: No Chest pain?: No  Respiratory Cough?: No Shortness of breath?: Yes  Endocrine Excessive thirst?: No  Musculoskeletal Back pain?: Yes Joint pain?: No  Neurological Headaches?: No Dizziness?: No  Psychologic Depression?: No Anxiety?: No  Physical Exam: BP (!) 154/91   Pulse 67   Ht 5\' 7"  (1.702 m)   Wt 289 lb (131.1 kg)   BMI 45.26 kg/m    Constitutional:  Alert and oriented, No  acute distress. HEENT: [place] AT, moist mucus membranes.  Trachea midline, no masses. Cardiovascular: No clubbing, cyanosis, or edema. Respiratory: Normal respiratory effort, no increased work of breathing. GI: Abdomen is soft, nontender, nondistended, no abdominal masses GU: No CVA tenderness.  Testes descended bilaterally without masses or tenderness.  Prostate 35 g, smooth without nodules. Skin: No rashes, bruises or suspicious lesions. Lymph: No cervical or inguinal adenopathy. Neurologic: Grossly intact, no focal deficits, moving all 4 extremities. Psychiatric: Normal mood and affect.  Laboratory Data: Lab Results  Component Value Date   WBC 7.9 03/01/2017   HGB 17.1 (H) 03/01/2017   HCT 49.5 03/01/2017   MCV 96.5 03/01/2017   PLT 253.0 03/01/2017    Lab Results  Component Value Date   CREATININE 1.05 03/01/2017      Lab Results  Component Value Date   TESTOSTERONE 169.30 (L) 03/01/2017    Lab Results  Component Value Date   HGBA1C 6.3 03/01/2017    Urinalysis Dipstick/microscopy negative    Assessment & Plan:    1.  Hypogonadism His only symptom is tiredness and fatigue which started after a significant weight gain.  He most likely has undiagnosed sleep apnea and his fatigue may be related to this condition.  Since untreated sleep apnea is a relative contraindication to testosterone replacement therapy would defer any treatment of his hypogonadism until his sleep evaluation is complete.  I did recommend a repeat a.m. testosterone level along with an LH and prolactin.  He will follow-up once his pulmonary evaluation is complete.  2.  Lower urinary tract symptoms This may be secondary to BPH however his most bothersome symptom is nocturia which again could be related to sleep apnea.  PVR by bladder scan was 37 mL.  I recommended an initial alpha-blocker trial to assess for any symptomatic improvement.  Rx tamsulosin was sent to his pharmacy.  - Urinalysis,  Complete - BLADDER SCAN AMB NON-IMAGING    Abbie Sons, MD  [facility] [address], [place], [place] [address] [phone]

## 2017-03-28 LAB — PROLACTIN: Prolactin: 8.9 ng/mL (ref 4.0–15.2)

## 2017-03-28 LAB — TESTOSTERONE: Testosterone: 254 ng/dL — ABNORMAL LOW (ref 264–916)

## 2017-03-28 LAB — LUTEINIZING HORMONE: LH: 4.9 m[IU]/mL (ref 1.7–8.6)

## 2017-03-29 ENCOUNTER — Telehealth: Payer: Self-pay

## 2017-03-29 NOTE — Telephone Encounter (Signed)
-----   Message from Abbie Sons, MD sent at 03/28/2017  9:54 AM EST ----- Repeat testosterone level remains low but it better at 254.  His pituitary hormones were normal.

## 2017-03-29 NOTE — Telephone Encounter (Signed)
Letter sent.

## 2017-04-24 ENCOUNTER — Encounter: Payer: Self-pay | Admitting: Urology

## 2017-04-26 ENCOUNTER — Encounter: Payer: Self-pay | Admitting: Urology

## 2017-04-28 ENCOUNTER — Other Ambulatory Visit: Payer: Self-pay

## 2017-04-28 MED ORDER — TAMSULOSIN HCL 0.4 MG PO CAPS: 0.4000 mg | ORAL_CAPSULE | Freq: Every day | ORAL | 10 refills | Status: DC

## 2017-08-28 ENCOUNTER — Encounter: Payer: Self-pay | Admitting: Family Medicine

## 2017-09-14 ENCOUNTER — Encounter: Payer: Self-pay | Admitting: Family Medicine

## 2017-09-15 ENCOUNTER — Ambulatory Visit (INDEPENDENT_AMBULATORY_CARE_PROVIDER_SITE_OTHER)
Admission: RE | Admit: 2017-09-15 | Discharge: 2017-09-15 | Disposition: A | Discharge status: Home / Self Care | Payer: Managed Care, Other (non HMO) | Source: Ambulatory Visit | Attending: Family Medicine | Admitting: Family Medicine

## 2017-09-15 ENCOUNTER — Ambulatory Visit: Payer: Managed Care, Other (non HMO) | Admitting: Family Medicine

## 2017-09-15 ENCOUNTER — Encounter: Payer: Self-pay | Admitting: Family Medicine

## 2017-09-15 VITALS — BP 146/76 | HR 80 | Temp 98.8°F | Ht 67.0 in | Wt 292.0 lb

## 2017-09-15 DIAGNOSIS — G4733 Obstructive sleep apnea (adult) (pediatric): Secondary | ICD-10-CM

## 2017-09-15 DIAGNOSIS — R7303 Prediabetes: Secondary | ICD-10-CM | POA: Diagnosis not present

## 2017-09-15 DIAGNOSIS — Z6841 Body Mass Index (BMI) 40.0 and over, adult: Secondary | ICD-10-CM

## 2017-09-15 DIAGNOSIS — M47819 Spondylosis without myelopathy or radiculopathy, site unspecified: Secondary | ICD-10-CM

## 2017-09-15 DIAGNOSIS — M479 Spondylosis, unspecified: Secondary | ICD-10-CM

## 2017-09-15 DIAGNOSIS — M5442 Lumbago with sciatica, left side: Secondary | ICD-10-CM

## 2017-09-15 DIAGNOSIS — G8929 Other chronic pain: Secondary | ICD-10-CM

## 2017-09-15 DIAGNOSIS — M4310 Spondylolisthesis, site unspecified: Secondary | ICD-10-CM

## 2017-09-15 HISTORY — DX: Prediabetes: R73.03

## 2017-09-15 NOTE — Patient Instructions (Addendum)
Good to see you today  Continue to work on healthy food choices, activity as tolerated- swimming/ walking in water if possible  I will notify you of xray and lab results and we will come up with a plan   Please schedule a follow up visit for February 2020 to monitor weight and

## 2017-09-15 NOTE — Progress Notes (Signed)
Subjective:    Patient ID: Mitchell Lewis, male    DOB: 1960-05-10, 57 y.o.   MRN: [id number]  HPI This is a 57 yo male, accompanied by his wife, who presents today with intermittent back pain x 3 years. Low back pain which is off and on but has gotten worse. Pain with walking, sitting for prolonged periods of time. Burning, numbness down front of left thigh. Pain is aching. Stiff with driving. Was seen 1/19 to establish care and was given meloxicam and back exercises. He reports no relief with meloxicam and little relief with ibuprofen 400 mg q am. No spasm. No bowel or bladder incontinence, no diarrhea/constipaion. No weight loss. Not doing any exercises.  Would like a referral to rheumatology. Called Dr. Kirke Corin office and was told to get blood work, referral, OV notes. Has a friend with psoriatic arthritis who is getting good relief with weekly injections.  Had xray lumbar spin 02/17/17, no imaging since according to the patient. Results from 02/17/17- Mild grade 1 anterolisthesis of L4 on L5. Question chronic L5 pars defects; spondylosis.   Was seen by pulmonary for suspected sleep apnea. Had home sleep study which showed severe obstructive sleep apnea, nocturnal hypoxemia. Due to complexity of sleep issues a Cpap titration was ordered but patient reports that there was something wrong with the test and he "got tired of fooling with it." He reports a weight loss of 30 pounds (has since gained it back) and decreased snoring.    Past Medical History:  Diagnosis Date  . Colon polyp   . Fatty liver   . History of colon polyps 2018  . Sleep apnea    Past Surgical History:  Procedure Laterality Date  . COLONOSCOPY  01/09/2017  . TMJ ARTHROSCOPY  1993  . VASECTOMY    . WISDOM TOOTH EXTRACTION  03/17/2017   Family History  Problem Relation Age of Onset  . Diabetes Mother   . Cancer Father   . Heart disease Father   . Kidney failure Father   . Prostate cancer Paternal Grandfather     Social History   Tobacco Use  . Smoking status: Former Smoker    Packs/day: 1.00    Years: 40.00    Pack years: 40.00    Types: Cigarettes    Last attempt to quit: 05/15/2013    Years since quitting: 4.3  . Smokeless tobacco: Never Used  Substance Use Topics  . Alcohol use: Yes    Alcohol/week: 1.0 standard drinks    Types: 1 Standard drinks or equivalent per week  . Drug use: No      Review of Systems Per HPI    Objective:   Physical Exam  Constitutional: He is oriented to person, place, and time. He appears well-developed and well-nourished. No distress.  Morbidly obese.   HENT:  Head: Normocephalic and atraumatic.  Eyes: Conjunctivae are normal.  Neck: Normal range of motion. Neck supple.  Cardiovascular: Normal rate, regular rhythm and normal heart sounds.  Pulmonary/Chest: Effort normal and breath sounds normal.  Musculoskeletal:       Cervical back: Normal.       Thoracic back: Normal.       Lumbar back: He exhibits bony tenderness. He exhibits normal range of motion, no tenderness, no swelling and no edema.  Neurological: He is alert and oriented to person, place, and time. He displays normal reflexes. He exhibits normal muscle tone.  Skin: Skin is warm and dry. He is not diaphoretic.  Psychiatric: He has a normal mood and affect. His behavior is normal. Judgment and thought content normal.  Vitals reviewed.     BP (!) 146/76 (BP Location: Right Arm, Patient Position: Sitting, Cuff Size: Large)   Pulse 80   Temp 98.8 F (37.1 C) (Oral)   Ht 5\' 7"  (1.702 m)   Wt 292 lb (132.5 kg)   SpO2 93%   BMI 45.73 kg/m  Wt Readings from Last 3 Encounters:  09/15/17 292 lb (132.5 kg)  03/27/17 289 lb (131.1 kg)  03/02/17 297 lb (134.7 kg)       Assessment & Plan:  1. Chronic midline low back pain with left-sided sciatica - will get results of xray and lab and determine next steps, advised patient and his wife that rheumatology referral may not be appropriate  depending on labs, they voiced understanding - DG Lumbar Spine Complete; Future - Sedimentation rate - Rheumatoid factor  2. Prediabetes - Comprehensive metabolic panel - Hemoglobin A1c  3. Class 3 severe obesity due to excess calories without serious comorbidity with body mass index (BMI) of 45.0 to 49.9 in adult [facility]) - discussed effects of OSA on his ability to lose weight - contributing to back pain  4. Severe obstructive sleep apnea-hypopnea syndrome - attempted to discuss long term ramifications of untreated OSA with patient and his wife, he stated "I'm here about my back."  - will continue to address as this is likely contributing to pain, inability to loose weight and fatigue  - follow up in 6 months  Clarene Reamer, FNP-BC  [facility], [place]  09/15/2017 4:45 PM

## 2017-09-16 LAB — COMPREHENSIVE METABOLIC PANEL
AG Ratio: 1.5 (calc) (ref 1.0–2.5)
ALT: 58 U/L — ABNORMAL HIGH (ref 9–46)
AST: 37 U/L — ABNORMAL HIGH (ref 10–35)
Albumin: 4 g/dL (ref 3.6–5.1)
Alkaline phosphatase (APISO): 83 U/L (ref 40–115)
BUN: 15 mg/dL (ref 7–25)
CO2: 27 mmol/L (ref 20–32)
Calcium: 9.4 mg/dL (ref 8.6–10.3)
Chloride: 104 mmol/L (ref 98–110)
Creat: 1.07 mg/dL (ref 0.70–1.33)
Globulin: 2.6 g/dL (calc) (ref 1.9–3.7)
Glucose, Bld: 117 mg/dL — ABNORMAL HIGH (ref 65–99)
Potassium: 4.5 mmol/L (ref 3.5–5.3)
Sodium: 141 mmol/L (ref 135–146)
Total Bilirubin: 0.5 mg/dL (ref 0.2–1.2)
Total Protein: 6.6 g/dL (ref 6.1–8.1)

## 2017-09-16 LAB — HEMOGLOBIN A1C
Hgb A1c MFr Bld: 6.2 % of total Hgb — ABNORMAL HIGH (ref <5.7)
Mean Plasma Glucose: 131 (calc)
eAG (mmol/L): 7.3 (calc)

## 2017-09-16 LAB — SEDIMENTATION RATE: Sed Rate: 2 mm/h (ref 0–20)

## 2017-09-16 LAB — RHEUMATOID FACTOR

## 2017-09-18 ENCOUNTER — Encounter: Payer: Self-pay | Admitting: Family Medicine

## 2017-09-18 NOTE — Addendum Note (Signed)
Addended by: Clarene Reamer B on: 09/18/2017 04:18 PM   Modules accepted: Orders

## 2017-09-25 ENCOUNTER — Ambulatory Visit: Payer: BLUE CROSS/BLUE SHIELD | Admitting: Family Medicine

## 2017-09-27 ENCOUNTER — Telehealth: Payer: Self-pay | Admitting: Emergency Medicine

## 2017-09-27 NOTE — Telephone Encounter (Signed)
Called and spoke with patient he states that he got into see the orthopedics and needs nothing further need at his time.    Copied from [place] [phone]. Topic: General - Other >> Sep 19, 2017 12:38 PM Judyann Munson wrote: Reason for [id number] is requesting call back in regards to his my chart message. Please advise

## 2017-10-06 ENCOUNTER — Other Ambulatory Visit: Payer: Self-pay | Admitting: Orthopedic Surgery

## 2017-10-06 DIAGNOSIS — M4696 Unspecified inflammatory spondylopathy, lumbar region: Secondary | ICD-10-CM

## 2017-10-06 DIAGNOSIS — M5416 Radiculopathy, lumbar region: Secondary | ICD-10-CM

## 2017-10-14 ENCOUNTER — Ambulatory Visit
Admission: RE | Admit: 2017-10-14 | Discharge: 2017-10-14 | Disposition: A | Discharge status: Home / Self Care | Payer: Managed Care, Other (non HMO) | Source: Ambulatory Visit | Attending: Orthopedic Surgery | Admitting: Orthopedic Surgery

## 2017-10-14 DIAGNOSIS — M4696 Unspecified inflammatory spondylopathy, lumbar region: Secondary | ICD-10-CM

## 2017-10-14 DIAGNOSIS — M5416 Radiculopathy, lumbar region: Secondary | ICD-10-CM

## 2017-11-01 ENCOUNTER — Other Ambulatory Visit: Payer: Self-pay | Admitting: Orthopedic Surgery

## 2017-11-01 DIAGNOSIS — M5416 Radiculopathy, lumbar region: Secondary | ICD-10-CM

## 2017-11-01 DIAGNOSIS — M4696 Unspecified inflammatory spondylopathy, lumbar region: Secondary | ICD-10-CM

## 2017-11-17 ENCOUNTER — Telehealth: Payer: Self-pay | Admitting: Internal Medicine

## 2017-11-17 LAB — PROTIME-INR

## 2017-11-17 NOTE — Telephone Encounter (Signed)
Deleting recall per patient request declined to be seen

## 2017-11-27 NOTE — Progress Notes (Signed)
Mitchell Lewis informed Henrine Screws, RN, that he is not having MRI - due to cost.  Patient was instructed to notify Dr Laurena Bering office.  I called and left a voice message for Mitchell Lewis at Dr [facility] office.

## 2017-11-28 ENCOUNTER — Ambulatory Visit (HOSPITAL_COMMUNITY)
Admission: RE | Admit: 2017-11-28 | Payer: Managed Care, Other (non HMO) | Source: Ambulatory Visit | Admitting: Orthopedic Surgery

## 2017-11-28 ENCOUNTER — Encounter (HOSPITAL_COMMUNITY): Payer: Self-pay

## 2017-11-28 ENCOUNTER — Ambulatory Visit (HOSPITAL_COMMUNITY): Payer: Managed Care, Other (non HMO)

## 2017-11-28 SURGERY — MRI WITH ANESTHESIA
Anesthesia: General

## 2019-10-18 ENCOUNTER — Telehealth: Payer: Self-pay

## 2019-10-18 NOTE — Telephone Encounter (Addendum)
pts wife(DPR signed) was in office and asked for triage for pt; this morning x 2 pt had SOB; the first time lasted 1 hr and the second time around 6 AM lasted about 15'; pt also had neck tightness when having trouble breathing. Pt did not have CP or H/A or dizziness. Pt has no covid symptoms, no travel and no known exposure to + covid. Pt has not had covid vaccine but had + covid back in March 2021. Pt had hx of CP several yrs ago and was in hospital for 1 wk but no reason for CP found.pt has not seen card in many years. No available appts at [facility].Pt is presently at work and pts wife got pt on phone but pt and his wife said pt would go to [facility] today for eval. FYI to Glenda Chroman FNP.

## 2019-10-18 NOTE — Telephone Encounter (Signed)
Noted. Agree with evaluation at [facility].

## 2019-11-04 ENCOUNTER — Other Ambulatory Visit: Payer: Self-pay

## 2019-11-04 ENCOUNTER — Encounter: Payer: Self-pay | Admitting: Family Medicine

## 2019-11-04 ENCOUNTER — Ambulatory Visit (INDEPENDENT_AMBULATORY_CARE_PROVIDER_SITE_OTHER): Payer: 59 | Admitting: Family Medicine

## 2019-11-04 VITALS — BP 144/80 | HR 72 | Temp 97.4°F | Ht 67.0 in | Wt 299.8 lb

## 2019-11-04 DIAGNOSIS — Z6841 Body Mass Index (BMI) 40.0 and over, adult: Secondary | ICD-10-CM | POA: Diagnosis not present

## 2019-11-04 DIAGNOSIS — R0789 Other chest pain: Secondary | ICD-10-CM | POA: Diagnosis not present

## 2019-11-04 DIAGNOSIS — F419 Anxiety disorder, unspecified: Secondary | ICD-10-CM

## 2019-11-04 DIAGNOSIS — F32A Depression, unspecified: Secondary | ICD-10-CM

## 2019-11-04 DIAGNOSIS — R29898 Other symptoms and signs involving the musculoskeletal system: Secondary | ICD-10-CM

## 2019-11-04 DIAGNOSIS — E66813 Obesity, class 3: Secondary | ICD-10-CM

## 2019-11-04 DIAGNOSIS — R7303 Prediabetes: Secondary | ICD-10-CM

## 2019-11-04 LAB — COMPREHENSIVE METABOLIC PANEL
ALT: 75 U/L — ABNORMAL HIGH (ref 0–53)
AST: 42 U/L — ABNORMAL HIGH (ref 0–37)
Albumin: 4.4 g/dL (ref 3.5–5.2)
Alkaline Phosphatase: 69 U/L (ref 39–117)
BUN: 26 mg/dL — ABNORMAL HIGH (ref 6–23)
CO2: 25 mEq/L (ref 19–32)
Calcium: 9.4 mg/dL (ref 8.4–10.5)
Chloride: 105 mEq/L (ref 96–112)
Creatinine, Ser: 1.04 mg/dL (ref 0.40–1.50)
GFR: 72.54 mL/min (ref >60.00)
Glucose, Bld: 120 mg/dL — ABNORMAL HIGH (ref 70–99)
Potassium: 4.2 mEq/L (ref 3.5–5.1)
Sodium: 139 mEq/L (ref 135–145)
Total Bilirubin: 0.8 mg/dL (ref 0.2–1.2)
Total Protein: 6.8 g/dL (ref 6.0–8.3)

## 2019-11-04 LAB — LIPID PANEL
Cholesterol: 137 mg/dL (ref 0–200)
HDL: 35.1 mg/dL — ABNORMAL LOW (ref >39.00)
LDL Cholesterol: 71 mg/dL (ref 0–99)
NonHDL: 101.43
Total CHOL/HDL Ratio: 4
Triglycerides: 152 mg/dL — ABNORMAL HIGH (ref 0.0–149.0)
VLDL: 30.4 mg/dL (ref 0.0–40.0)

## 2019-11-04 LAB — VITAMIN D 25 HYDROXY (VIT D DEFICIENCY, FRACTURES): VITD: 48.3 ng/mL (ref 30.00–100.00)

## 2019-11-04 LAB — TSH: TSH: 1.9 u[IU]/mL (ref 0.35–4.50)

## 2019-11-04 LAB — HEMOGLOBIN A1C: Hgb A1c MFr Bld: 6.4 % (ref 4.6–6.5)

## 2019-11-04 MED ORDER — SERTRALINE HCL 50 MG PO TABS: 50.0000 mg | ORAL_TABLET | Freq: Every day | ORAL | 3 refills | Status: DC

## 2019-11-04 NOTE — Patient Instructions (Signed)
Good to see you today  Start 1/2 tablet sertraline for 5 days then increase to 1 tablet  Follow up for your complete physcial in 8 weeks   There is not one right eating plan for everyone.  It may take trial and error to find what will work for you.  It is important to get adequate protein and fiber with your meals.  It is okay to not eat breakfast or to skip meals if you are not hungry.  Avoid snacking between meals.  Unless you are on a fluid restriction, drink 80 to 90 ounces of water a day.  Suggested resources- www.dietdoctor.com/diabetes/diet www.adaptyourlifeacademy.com-there is a quiz to help you determine how many carbohydrates you should eat a day  www.thefastingmethod.com  Here are some guidelines to help you with meal planning -  Avoid all processed and packaged foods (bread, pasta, crackers, chips, etc) and beverages containing calories.  Avoid added sugars and excessive natural sugars.  Pay attention to how you feel if you consume artificial sweeteners.  Do they make you more hungry or raise your blood sugar?  With every meal and snack, aim to get 20 g of protein (3 ounces of meat, 4 ounces of fish, 3 eggs, protein powder, 1 cup [place] yogurt, 1 cup cottage cheese, etc.)  Increase fiber in the form of non-starchy vegetables.  These help you feel full with very little carbohydrates and are good for gut health.  Nonstarchy vegetables include summer squash, onions, peppers, tomatoes, eggplant, broccoli, cauliflower, cabbage, lettuce, spinach.  Have small amounts of good fats such as avocado, nuts, olive oil, nut butters, olives.  Add a little cheese to your meals to make them tasty.   Try to plan your meals for the week and do some meal preparation when able.  If possible, make lunches for the week ahead of time.  Plan a couple of dinners and make enough so you can have leftovers.  Build in a treat once a week.

## 2019-11-04 NOTE — Progress Notes (Signed)
Subjective:    Patient ID: Mitchell Lewis, male    DOB: April 08, 1960, 59 y.o.   MRN: [id number]  HPI Chief Complaint  Patient presents with  . Shortness of Breath    x 1 week went to hospital 2 wks ago. F/u  . Panic Attack   This is a 59 year old male who presents today with above chief complaint.  He is accompanied by his wife. He has not been seen for primary care in greater than 2 years.  Has had a couple of episodes of "neck tightness." He was seen in out of town ER approximately 2 weeks ago.  He had negative serial troponins, negative CBC, normal EKG.  Cmet showed elevated glucose, decreased GFR, elevated LFTs. Has had 2 attacks that he thinks are panic related. No known trigger. Out of the blue. Neck tightness. First lasted 10-15 minutes, second about 10 minutes. Saw cardiologist several years ago. Work up did not reveal any cardiac concerns.   Has noticed 6 months of increased SOB, weight gain. Thinks SOB related to weight.   Increased stress, more edgy. Work is fine.   Poor sleep- goes to sleep ok, up and down all night, occasionally to go to the bathroom. Has trouble falling back to sleep. Snores. Was ordered a sleep study in past, was too expensive.   Morbid obesity- lost some weight with keto. Drinks water. Occasionally has a couple of beers. Eating 3 meals a day. Breakfast- Eggs/ sausage/ bacon. Lunch- eats out, meat and vegetables.  Dinner- eats out frequently,  meat, vegetables. Few starches. No drinks with sugar, no snacks. Does not eat fast foods, eats meals out at "restaurants."   Review of Systems Per HPI    Objective:   Physical Exam Vitals reviewed.  Constitutional:      General: He is not in acute distress.    Appearance: He is well-developed. He is obese. He is not ill-appearing, toxic-appearing or diaphoretic.  HENT:     Head: Normocephalic and atraumatic.     Right Ear: External ear normal.     Left Ear: External ear normal.  Eyes:     Conjunctiva/sclera:  Conjunctivae normal.  Neck:     Vascular: No carotid bruit.  Cardiovascular:     Rate and Rhythm: Normal rate and regular rhythm.     Pulses: Normal pulses.     Heart sounds: Normal heart sounds.  Pulmonary:     Effort: Pulmonary effort is normal.     Breath sounds: Normal breath sounds.  Musculoskeletal:     Cervical back: Normal range of motion and neck supple.     Right lower leg: Edema (1+) present.     Left lower leg: Edema (1+) present.  Skin:    General: Skin is warm and dry.  Neurological:     Mental Status: He is alert and oriented to person, place, and time.  Psychiatric:        Mood and Affect: Mood normal.        Behavior: Behavior normal.        Thought Content: Thought content normal.        Judgment: Judgment normal.       BP (!) 144/80   Pulse 72   Temp (!) 97.4 F (36.3 C) (Temporal)   Ht 5\' 7"  (1.702 m)   Wt 299 lb 12 oz (136 kg)   SpO2 95%   BMI 46.95 kg/m  Wt Readings from Last 3 Encounters:  11/04/19 299 lb  12 oz (136 kg)  09/15/17 292 lb (132.5 kg)  03/27/17 289 lb (131.1 kg)   Depression screen [facility] 2/9 11/04/2019 03/01/2017 02/16/2017  Decreased Interest 2 1 0  Down, Depressed, Hopeless 2 1 0  PHQ - 2 Score 4 2 0  Altered sleeping 3 2 -  Tired, decreased energy 3 2 -  Change in appetite 3 1 -  Feeling bad or failure about yourself  0 0 -  Trouble concentrating - 0 -  Moving slowly or fidgety/restless 0 0 -  Suicidal thoughts 0 0 -  PHQ-9 Score 13 7 -  Difficult doing work/chores Somewhat difficult Somewhat difficult -       Assessment & Plan:  1. Prediabetes - Comprehensive metabolic panel - Hemoglobin A1c  2. Class 3 severe obesity due to excess calories without serious comorbidity with body mass index (BMI) of 45.0 to 49.9 in adult [facility]) - discussed his weight in relation to his health, encouraged him to make healthy food choices, shorten eating window - discussed effect of stress, sleep on weight - TSH - Comprehensive metabolic  panel - Vitamin D, 25-hydroxy - Hemoglobin A1c - Lipid Panel  3. Chest tightness - recent work up in ER negative, will refer to cardiology for additional work up - Ambulatory referral to Cardiology  4. Neck tightness - per #3 - Ambulatory referral to Cardiology  5. Anxiety and depression - sertraline 50 mg po qd - Provided written and verbal information regarding diagnosis and treatment. - follow up in 4 weeks  This visit occurred during the SARS-CoV-2 public health emergency.  Safety protocols were in place, including screening questions prior to the visit, additional usage of staff PPE, and extensive cleaning of exam room while observing appropriate contact time as indicated for disinfecting solutions.      Clarene Reamer, FNP-BC  [facility], [facility]  11/04/2019 2:18 PM

## 2019-11-08 ENCOUNTER — Other Ambulatory Visit: Payer: Self-pay | Admitting: Family Medicine

## 2019-11-08 ENCOUNTER — Encounter: Payer: Self-pay | Admitting: Family Medicine

## 2019-11-08 DIAGNOSIS — E785 Hyperlipidemia, unspecified: Secondary | ICD-10-CM | POA: Insufficient documentation

## 2019-11-08 DIAGNOSIS — R7303 Prediabetes: Secondary | ICD-10-CM

## 2019-11-08 MED ORDER — ATORVASTATIN CALCIUM 20 MG PO TABS: 20.0000 mg | ORAL_TABLET | Freq: Every day | ORAL | 3 refills | Status: DC

## 2019-11-08 NOTE — Progress Notes (Signed)
The 10-year ASCVD risk score Mikey Bussing DC Brooke Bonito., et al., 2013) is: 10.5%   Values used to calculate the score:     Age: 59 years     Sex: Male     Is Non-Hispanic African American: No     Diabetic: No     Tobacco smoker: No     Systolic Blood Pressure: 949 mmHg     Is BP treated: No     HDL Cholesterol: 35.1 mg/dL     Total Cholesterol: 137 mg/dL Patient agreeable to atorvastatin, rx sent to patient's pharmacy

## 2019-11-25 ENCOUNTER — Encounter: Payer: Self-pay | Admitting: Cardiology

## 2019-11-25 ENCOUNTER — Ambulatory Visit: Payer: Managed Care, Other (non HMO) | Admitting: Cardiology

## 2019-11-25 ENCOUNTER — Other Ambulatory Visit: Payer: Self-pay

## 2019-11-25 VITALS — BP 144/90 | HR 66 | Ht 68.0 in | Wt 290.5 lb

## 2019-11-25 DIAGNOSIS — E7800 Pure hypercholesterolemia, unspecified: Secondary | ICD-10-CM | POA: Diagnosis not present

## 2019-11-25 DIAGNOSIS — R030 Elevated blood-pressure reading, without diagnosis of hypertension: Secondary | ICD-10-CM

## 2019-11-25 DIAGNOSIS — R0609 Other forms of dyspnea: Secondary | ICD-10-CM

## 2019-11-25 DIAGNOSIS — R0600 Dyspnea, unspecified: Secondary | ICD-10-CM | POA: Diagnosis not present

## 2019-11-25 NOTE — Progress Notes (Signed)
Cardiology Office Note:    Date:  11/25/2019   ID:  Mitchell Lewis, DOB [birth year], MRN [id number]  PCP:  Elby Beck, FNP  [facility] Cardiologist:  Kate Sable, MD  [facility] Electrophysiologist:  None   Referring MD: Elby Beck, FNP   Chief Complaint  Patient presents with  . OTHER    Chest and neck tightness. Meds reviewed verbally with pt.   Mitchell Lewis is a 59 y.o. male who is being seen today for the evaluation of chest pain at the request of Elby Beck, FNP.   History of Present Illness:    Mitchell Lewis is a 60 y.o. male with a hx of hyperlipidemia, obesity who presents due to shortness of breath and neck tightness.  Patient states having worsening shortness of breath over the past year.  He is a Nature conservation officer, and has been finding it difficult to do his normal task.  Walking from the parking lot to the office today was difficult.  He denies chest pain.  Denies palpitations.  He endorses snoring, daytime somnolence, fatigue, feeling tired despite adequate time sleeping.  Was recently started on Lipitor for hyperlipidemia.  Denies smoking.  Is working on losing some weight.  Currently started on sertraline due to multiple deaths in the family.  Denies any family history of heart disease.  Past Medical History:  Diagnosis Date  . Colon polyp   . Fatty liver   . History of colon polyps 2018  . Sleep apnea     Past Surgical History:  Procedure Laterality Date  . COLONOSCOPY  01/09/2017  . TMJ ARTHROSCOPY  1993  . VASECTOMY    . WISDOM TOOTH EXTRACTION  03/17/2017    Current Medications: Current Meds  Medication Sig  . Multiple Vitamin (MULTIVITAMIN) tablet Take 1 tablet by mouth daily.  . Multiple Vitamins-Minerals (ZINC PO) Take by mouth.  . sertraline (ZOLOFT) 50 MG tablet Take 1 tablet (50 mg total) by mouth daily.  Marland Kitchen VITAMIN D PO Take by mouth.     Allergies:   Sulfa antibiotics   Social History    Socioeconomic History  . Marital status: Married    Spouse name: Not on file  . Number of children: 2  . Years of education: Not on file  . Highest education level: Not on file  Occupational History  . Occupation: full time Super  Tobacco Use  . Smoking status: Former Smoker    Packs/day: 1.00    Years: 40.00    Pack years: 40.00    Types: Cigarettes    Quit date: 05/15/2013    Years since quitting: 6.5  . Smokeless tobacco: Never Used  Vaping Use  . Vaping Use: Never used  Substance and Sexual Activity  . Alcohol use: Yes    Alcohol/week: 1.0 standard drink    Types: 1 Standard drinks or equivalent per week  . Drug use: No  . Sexual activity: Not on file  Other Topics Concern  . Not on file  Social History Narrative  . Not on file   Social Determinants of Health   Financial Resource Strain:   . Difficulty of Paying Living Expenses: Not on file  Food Insecurity:   . Worried About Charity fundraiser in the Last Year: Not on file  . Ran Out of Food in the Last Year: Not on file  Transportation Needs:   . Lack of Transportation (Medical): Not on file  . Lack of  Transportation (Non-Medical): Not on file  Physical Activity:   . Days of Exercise per Week: Not on file  . Minutes of Exercise per Session: Not on file  Stress:   . Feeling of Stress : Not on file  Social Connections:   . Frequency of Communication with Friends and Family: Not on file  . Frequency of Social Gatherings with Friends and Family: Not on file  . Attends Religious Services: Not on file  . Active Member of Clubs or Organizations: Not on file  . Attends Archivist Meetings: Not on file  . Marital Status: Not on file     Family History: The patient's family history includes Cancer in his father; Diabetes in his mother; Heart disease in his father; Kidney failure in his father; Prostate cancer in his paternal grandfather.  ROS:   Please see the history of present illness.     All  other systems reviewed and are negative.  EKGs/Labs/Other Studies Reviewed:    The following studies were reviewed today:   EKG:  EKG is  ordered today.  The ekg ordered today demonstrates normal sinus rhythm, normal ECG.  Recent Labs: 11/04/2019: ALT 75; BUN 26; Creatinine, Ser 1.04; Potassium 4.2; Sodium 139; TSH 1.90  Recent Lipid Panel    Component Value Date/Time   CHOL 137 11/04/2019 1011   TRIG 152.0 (H) 11/04/2019 1011   HDL 35.10 (L) 11/04/2019 1011   CHOLHDL 4 11/04/2019 1011   VLDL 30.4 11/04/2019 1011   LDLCALC 71 11/04/2019 1011     Risk Assessment/Calculations:      Physical Exam:    VS:  BP (!) 144/90 (BP Location: Right Arm, Patient Position: Sitting, Cuff Size: Large)   Pulse 66   Ht 5\' 8"  (1.727 m)   Wt 290 lb 8 oz (131.8 kg)   SpO2 97%   BMI 44.17 kg/m     Wt Readings from Last 3 Encounters:  11/25/19 290 lb 8 oz (131.8 kg)  11/04/19 299 lb 12 oz (136 kg)  09/15/17 292 lb (132.5 kg)     GEN:  Well nourished, well developed in no acute distress HEENT: Normal NECK: No JVD; No carotid bruits LYMPHATICS: No lymphadenopathy CARDIAC: RRR, no murmurs, rubs, gallops RESPIRATORY:  Clear to auscultation without rales, wheezing or rhonchi  ABDOMEN: Soft, non-tender, distended MUSCULOSKELETAL:  No edema; No deformity  SKIN: Warm and dry NEUROLOGIC:  Alert and oriented x 3 PSYCHIATRIC:  Normal affect   ASSESSMENT:    1. DOE (dyspnea on exertion)   2. Pure hypercholesterolemia   3. Morbid obesity ([place])   4. Elevated BP without diagnosis of hypertension    PLAN:    In order of problems listed above:  1. Patient with worsening dyspnea on exertion.  Has risk factors of obesity, hyperlipidemia.  Get echocardiogram to evaluate systolic and diastolic function.  Get Lexiscan Myoview to evaluate presence of ischemia.  It is possible symptoms are likely secondary to sleep apnea.  If cardiac work-up as above is normal, will recommend patient for sleep study  and appropriate management if diagnosed. 2. History of hyperlipidemia, start Lipitor as prescribed. 3. Patient is morbidly obese, weight loss advised. 4. Patient's blood pressure is elevated.  He was advised to check blood pressure at home and keep a log.  If blood pressure elevated at follow-up visit, will start antihypertensives.  Follow-up after echocardiogram and Myoview.   Medication Adjustments/Labs and Tests Ordered: Current medicines are reviewed at length with the patient today.  Concerns regarding medicines are outlined above.  Orders Placed This Encounter  Procedures  . NM Myocar Multi W/Spect W/Wall Motion / EF  . EKG 12-Lead  . ECHOCARDIOGRAM COMPLETE   No orders of the defined types were placed in this encounter.   Patient Instructions  Medication Instructions:  Your physician recommends that you continue on your current medications as directed. Please refer to the Current Medication list given to you today.  *If you need a refill on your cardiac medications before your next appointment, please call your pharmacy*   Lab Work: none If you have labs (blood work) drawn today and your tests are completely normal, you will receive your results only by: Marland Kitchen MyChart Message (if you have MyChart) OR . A paper copy in the mail If you have any lab test that is abnormal or we need to change your treatment, we will call you to review the results.   Testing/Procedures: Your physician has requested that you have an echocardiogram. Echocardiography is a painless test that uses sound waves to create images of your heart. It provides your doctor with information about the size and shape of your heart and how well your heart's chambers and valves are working. This procedure takes approximately one hour. There are no restrictions for this procedure. You may get an IV, if needed, to receive an ultrasound enhancing agent through to better visualize your heart.    Your physician has  requested that you have a lexiscan myoview. For further information please visit [email or web address]. Please follow instruction sheet, as given.  [place]  Your caregiver has ordered a Stress Test with nuclear imaging. The purpose of this test is to evaluate the blood supply to your heart muscle. This procedure is referred to as a "Non-Invasive Stress Test." This is because other than having an IV started in your vein, nothing is inserted or "invades" your body. Cardiac stress tests are done to find areas of poor blood flow to the heart by determining the extent of coronary artery disease (CAD). Some patients exercise on a treadmill, which naturally increases the blood flow to your heart, while others who are  unable to walk on a treadmill due to physical limitations have a pharmacologic/chemical stress agent called Lexiscan . This medicine will mimic walking on a treadmill by temporarily increasing your coronary blood flow.   Please note: these test may take anywhere between 2-4 hours to complete  PLEASE REPORT TO [place] AT THE FIRST DESK WILL DIRECT YOU WHERE TO GO  Date of Procedure:_____________________________________  Arrival Time for Procedure:______________________________   PLEASE NOTIFY THE OFFICE AT LEAST 24 HOURS IN ADVANCE IF YOU ARE UNABLE TO KEEP YOUR APPOINTMENT.  [phone] AND  PLEASE NOTIFY NUCLEAR MEDICINE AT [facility] AT LEAST 24 HOURS IN ADVANCE IF YOU ARE UNABLE TO KEEP YOUR APPOINTMENT. [phone]  How to prepare for your Myoview test:  5. Do not eat or drink after midnight 6. No caffeine for 24 hours prior to test 7. No smoking 24 hours prior to test. 8. Your medication may be taken with water.  If your doctor stopped a medication because of this test, do not take that medication. 9. Ladies, please do not wear dresses.  Skirts or pants are appropriate. Please wear a short sleeve shirt. 10. No perfume, cologne or  lotion. 11. Wear comfortable walking shoes.   Follow-Up: At [facility], you and your health needs are our priority.  As part of our continuing  mission to provide you with exceptional heart care, we have created designated Provider Care Teams.  These Care Teams include your primary Cardiologist (physician) and Advanced Practice Providers (APPs -  Physician Assistants and Nurse Practitioners) who all work together to provide you with the care you need, when you need it.  We recommend signing up for the patient portal called "MyChart".  Sign up information is provided on this After Visit Summary.  MyChart is used to connect with patients for Virtual Visits (Telemedicine).  Patients are able to view lab/test results, encounter notes, upcoming appointments, etc.  Non-urgent messages can be sent to your provider as well.   To learn more about what you can do with MyChart, go to [email or web address].    Your next appointment:   After testing is completed.   The format for your next appointment:   In Person  Provider:   You may see Kate Sable, MD or one of the following Advanced Practice Providers on your designated Care Team:    Murray Hodgkins, NP  Christell Faith, PA-C  Marrianne Mood, PA-C  Cadence Kathlen Mody, [place]     Cardiac Nuclear Scan A cardiac nuclear scan is a test that measures blood flow to the heart when a person is resting and when he or she is exercising. The test looks for problems such as:  Not enough blood reaching a portion of the heart.  The heart muscle not working normally. You may need this test if:  You have heart disease.  You have had abnormal lab results.  You have had heart surgery or a balloon procedure to open up blocked arteries (angioplasty).  You have chest pain.  You have shortness of breath. In this test, a radioactive dye (tracer) is injected into your bloodstream. After the tracer has traveled to your heart, an imaging device is used  to measure how much of the tracer is absorbed by or distributed to various areas of your heart. This procedure is usually done at a hospital and takes 2-4 hours. Tell a health care provider about:  Any allergies you have.  All medicines you are taking, including vitamins, herbs, eye drops, creams, and over-the-counter medicines.  Any problems you or family members have had with anesthetic medicines.  Any blood disorders you have.  Any surgeries you have had.  Any medical conditions you have.  Whether you are pregnant or may be pregnant. What are the risks? Generally, this is a safe procedure. However, problems may occur, including:  Serious chest pain and heart attack. This is only a risk if the stress portion of the test is done.  Rapid heartbeat.  Sensation of warmth in your chest. This usually passes quickly.  Allergic reaction to the tracer. What happens before the procedure?  Ask your health care provider about changing or stopping your regular medicines. This is especially important if you are taking diabetes medicines or blood thinners.  Follow instructions from your health care provider about eating or drinking restrictions.  Remove your jewelry on the day of the procedure. What happens during the procedure?  An IV will be inserted into one of your veins.  Your health care provider will inject a small amount of radioactive tracer through the IV.  You will wait for 20-40 minutes while the tracer travels through your bloodstream.  Your heart activity will be monitored with an electrocardiogram (ECG).  You will lie down on an exam table.  Images of your heart will be taken for about 15-20  minutes.  You may also have a stress test. For this test, one of the following may be done: ? You will exercise on a treadmill or stationary bike. While you exercise, your heart's activity will be monitored with an ECG, and your blood pressure will be checked. ? You will be  given medicines that will increase blood flow to parts of your heart. This is done if you are unable to exercise.  When blood flow to your heart has peaked, a tracer will again be injected through the IV.  After 20-40 minutes, you will get back on the exam table and have more images taken of your heart.  Depending on the type of tracer used, scans may need to be repeated 3-4 hours later.  Your IV line will be removed when the procedure is over. The procedure may vary among health care providers and hospitals. What happens after the procedure?  Unless your health care provider tells you otherwise, you may return to your normal schedule, including diet, activities, and medicines.  Unless your health care provider tells you otherwise, you may increase your fluid intake. This will help to flush the contrast dye from your body. Drink enough fluid to keep your urine pale yellow.  Ask your health care provider, or the department that is doing the test: ? When will my results be ready? ? How will I get my results? Summary  A cardiac nuclear scan measures the blood flow to the heart when a person is resting and when he or she is exercising.  Tell your health care provider if you are pregnant.  Before the procedure, ask your health care provider about changing or stopping your regular medicines. This is especially important if you are taking diabetes medicines or blood thinners.  After the procedure, unless your health care provider tells you otherwise, increase your fluid intake. This will help flush the contrast dye from your body.  After the procedure, unless your health care provider tells you otherwise, you may return to your normal schedule, including diet, activities, and medicines. This information is not intended to replace advice given to you by your health care provider. Make sure you discuss any questions you have with your health care provider. Document Revised: 07/03/2017 Document  Reviewed: 07/03/2017 Elsevier Patient Education  [place].     Echocardiogram An echocardiogram is a procedure that uses painless sound waves (ultrasound) to produce an image of the heart. Images from an echocardiogram can provide important information about:  Signs of coronary artery disease (CAD).  Aneurysm detection. An aneurysm is a weak or damaged part of an artery wall that bulges out from the normal force of blood pumping through the body.  Heart size and shape. Changes in the size or shape of the heart can be associated with certain conditions, including heart failure, aneurysm, and CAD.  Heart muscle function.  Heart valve function.  Signs of a past heart attack.  Fluid buildup around the heart.  Thickening of the heart muscle.  A tumor or infectious growth around the heart valves. Tell a health care provider about:  Any allergies you have.  All medicines you are taking, including vitamins, herbs, eye drops, creams, and over-the-counter medicines.  Any blood disorders you have.  Any surgeries you have had.  Any medical conditions you have.  Whether you are pregnant or may be pregnant. What are the risks? Generally, this is a safe procedure. However, problems may occur, including:  Allergic reaction to dye (  contrast) that may be used during the procedure. What happens before the procedure? No specific preparation is needed. You may eat and drink normally. What happens during the procedure?   An IV tube may be inserted into one of your veins.  You may receive contrast through this tube. A contrast is an injection that improves the quality of the pictures from your heart.  A gel will be applied to your chest.  A wand-like tool (transducer) will be moved over your chest. The gel will help to transmit the sound waves from the transducer.  The sound waves will harmlessly bounce off of your heart to allow the heart images to be captured in real-time  motion. The images will be recorded on a computer. The procedure may vary among health care providers and hospitals. What happens after the procedure?  You may return to your normal, everyday life, including diet, activities, and medicines, unless your health care provider tells you not to do that. Summary  An echocardiogram is a procedure that uses painless sound waves (ultrasound) to produce an image of the heart.  Images from an echocardiogram can provide important information about the size and shape of your heart, heart muscle function, heart valve function, and fluid buildup around your heart.  You do not need to do anything to prepare before this procedure. You may eat and drink normally.  After the echocardiogram is completed, you may return to your normal, everyday life, unless your health care provider tells you not to do that. This information is not intended to replace advice given to you by your health care provider. Make sure you discuss any questions you have with your health care provider. Document Revised: 05/10/2018 Document Reviewed: 02/20/2016 Elsevier Patient Education  2020 [place], Kate Sable, MD  11/25/2019 12:33 PM    [place]

## 2019-11-25 NOTE — Patient Instructions (Signed)
Medication Instructions:  Your physician recommends that you continue on your current medications as directed. Please refer to the Current Medication list given to you today.  *If you need a refill on your cardiac medications before your next appointment, please call your pharmacy*   Lab Work: none If you have labs (blood work) drawn today and your tests are completely normal, you will receive your results only by: Marland Kitchen MyChart Message (if you have MyChart) OR . A paper copy in the mail If you have any lab test that is abnormal or we need to change your treatment, we will call you to review the results.   Testing/Procedures: Your physician has requested that you have an echocardiogram. Echocardiography is a painless test that uses sound waves to create images of your heart. It provides your doctor with information about the size and shape of your heart and how well your heart's chambers and valves are working. This procedure takes approximately one hour. There are no restrictions for this procedure. You may get an IV, if needed, to receive an ultrasound enhancing agent through to better visualize your heart.    Your physician has requested that you have a lexiscan myoview. For further information please visit [email or web address]. Please follow instruction sheet, as given.  [place]  Your caregiver has ordered a Stress Test with nuclear imaging. The purpose of this test is to evaluate the blood supply to your heart muscle. This procedure is referred to as a "Non-Invasive Stress Test." This is because other than having an IV started in your vein, nothing is inserted or "invades" your body. Cardiac stress tests are done to find areas of poor blood flow to the heart by determining the extent of coronary artery disease (CAD). Some patients exercise on a treadmill, which naturally increases the blood flow to your heart, while others who are  unable to walk on a treadmill due to physical limitations  have a pharmacologic/chemical stress agent called Lexiscan . This medicine will mimic walking on a treadmill by temporarily increasing your coronary blood flow.   Please note: these test may take anywhere between 2-4 hours to complete  PLEASE REPORT TO [facility] AT THE FIRST DESK WILL DIRECT YOU WHERE TO GO  Date of Procedure:_____________________________________  Arrival Time for Procedure:______________________________   PLEASE NOTIFY THE OFFICE AT LEAST 24 HOURS IN ADVANCE IF YOU ARE UNABLE TO KEEP YOUR APPOINTMENT.  [phone] AND  PLEASE NOTIFY NUCLEAR MEDICINE AT [facility] AT LEAST 24 HOURS IN ADVANCE IF YOU ARE UNABLE TO KEEP YOUR APPOINTMENT. [phone]  How to prepare for your Myoview test:  1. Do not eat or drink after midnight 2. No caffeine for 24 hours prior to test 3. No smoking 24 hours prior to test. 4. Your medication may be taken with water.  If your doctor stopped a medication because of this test, do not take that medication. 5. Ladies, please do not wear dresses.  Skirts or pants are appropriate. Please wear a short sleeve shirt. 6. No perfume, cologne or lotion. 7. Wear comfortable walking shoes.   Follow-Up: At [facility], you and your health needs are our priority.  As part of our continuing mission to provide you with exceptional heart care, we have created designated Provider Care Teams.  These Care Teams include your primary Cardiologist (physician) and Advanced Practice Providers (APPs -  Physician Assistants and Nurse Practitioners) who all work together to provide you with the care you need, when you  need it.  We recommend signing up for the patient portal called "MyChart".  Sign up information is provided on this After Visit Summary.  MyChart is used to connect with patients for Virtual Visits (Telemedicine).  Patients are able to view lab/test results, encounter notes, upcoming appointments, etc.  Non-urgent messages can  be sent to your provider as well.   To learn more about what you can do with MyChart, go to [email or web address].    Your next appointment:   After testing is completed.   The format for your next appointment:   In Person  Provider:   You may see Kate Sable, MD or one of the following Advanced Practice Providers on your designated Care Team:    Murray Hodgkins, NP  Christell Faith, PA-C  Marrianne Mood, PA-C  Cadence Kathlen Mody, [place]     Cardiac Nuclear Scan A cardiac nuclear scan is a test that measures blood flow to the heart when a person is resting and when he or she is exercising. The test looks for problems such as:  Not enough blood reaching a portion of the heart.  The heart muscle not working normally. You may need this test if:  You have heart disease.  You have had abnormal lab results.  You have had heart surgery or a balloon procedure to open up blocked arteries (angioplasty).  You have chest pain.  You have shortness of breath. In this test, a radioactive dye (tracer) is injected into your bloodstream. After the tracer has traveled to your heart, an imaging device is used to measure how much of the tracer is absorbed by or distributed to various areas of your heart. This procedure is usually done at a hospital and takes 2-4 hours. Tell a health care provider about:  Any allergies you have.  All medicines you are taking, including vitamins, herbs, eye drops, creams, and over-the-counter medicines.  Any problems you or family members have had with anesthetic medicines.  Any blood disorders you have.  Any surgeries you have had.  Any medical conditions you have.  Whether you are pregnant or may be pregnant. What are the risks? Generally, this is a safe procedure. However, problems may occur, including:  Serious chest pain and heart attack. This is only a risk if the stress portion of the test is done.  Rapid heartbeat.  Sensation of warmth  in your chest. This usually passes quickly.  Allergic reaction to the tracer. What happens before the procedure?  Ask your health care provider about changing or stopping your regular medicines. This is especially important if you are taking diabetes medicines or blood thinners.  Follow instructions from your health care provider about eating or drinking restrictions.  Remove your jewelry on the day of the procedure. What happens during the procedure?  An IV will be inserted into one of your veins.  Your health care provider will inject a small amount of radioactive tracer through the IV.  You will wait for 20-40 minutes while the tracer travels through your bloodstream.  Your heart activity will be monitored with an electrocardiogram (ECG).  You will lie down on an exam table.  Images of your heart will be taken for about 15-20 minutes.  You may also have a stress test. For this test, one of the following may be done: ? You will exercise on a treadmill or stationary bike. While you exercise, your heart's activity will be monitored with an ECG, and your blood pressure will be checked. ?  You will be given medicines that will increase blood flow to parts of your heart. This is done if you are unable to exercise.  When blood flow to your heart has peaked, a tracer will again be injected through the IV.  After 20-40 minutes, you will get back on the exam table and have more images taken of your heart.  Depending on the type of tracer used, scans may need to be repeated 3-4 hours later.  Your IV line will be removed when the procedure is over. The procedure may vary among health care providers and hospitals. What happens after the procedure?  Unless your health care provider tells you otherwise, you may return to your normal schedule, including diet, activities, and medicines.  Unless your health care provider tells you otherwise, you may increase your fluid intake. This will help  to flush the contrast dye from your body. Drink enough fluid to keep your urine pale yellow.  Ask your health care provider, or the department that is doing the test: ? When will my results be ready? ? How will I get my results? Summary  A cardiac nuclear scan measures the blood flow to the heart when a person is resting and when he or she is exercising.  Tell your health care provider if you are pregnant.  Before the procedure, ask your health care provider about changing or stopping your regular medicines. This is especially important if you are taking diabetes medicines or blood thinners.  After the procedure, unless your health care provider tells you otherwise, increase your fluid intake. This will help flush the contrast dye from your body.  After the procedure, unless your health care provider tells you otherwise, you may return to your normal schedule, including diet, activities, and medicines. This information is not intended to replace advice given to you by your health care provider. Make sure you discuss any questions you have with your health care provider. Document Revised: 07/03/2017 Document Reviewed: 07/03/2017 Elsevier Patient Education  [place].     Echocardiogram An echocardiogram is a procedure that uses painless sound waves (ultrasound) to produce an image of the heart. Images from an echocardiogram can provide important information about:  Signs of coronary artery disease (CAD).  Aneurysm detection. An aneurysm is a weak or damaged part of an artery wall that bulges out from the normal force of blood pumping through the body.  Heart size and shape. Changes in the size or shape of the heart can be associated with certain conditions, including heart failure, aneurysm, and CAD.  Heart muscle function.  Heart valve function.  Signs of a past heart attack.  Fluid buildup around the heart.  Thickening of the heart muscle.  A tumor or infectious  growth around the heart valves. Tell a health care provider about:  Any allergies you have.  All medicines you are taking, including vitamins, herbs, eye drops, creams, and over-the-counter medicines.  Any blood disorders you have.  Any surgeries you have had.  Any medical conditions you have.  Whether you are pregnant or may be pregnant. What are the risks? Generally, this is a safe procedure. However, problems may occur, including:  Allergic reaction to dye (contrast) that may be used during the procedure. What happens before the procedure? No specific preparation is needed. You may eat and drink normally. What happens during the procedure?   An IV tube may be inserted into one of your veins.  You may receive contrast through this  tube. A contrast is an injection that improves the quality of the pictures from your heart.  A gel will be applied to your chest.  A wand-like tool (transducer) will be moved over your chest. The gel will help to transmit the sound waves from the transducer.  The sound waves will harmlessly bounce off of your heart to allow the heart images to be captured in real-time motion. The images will be recorded on a computer. The procedure may vary among health care providers and hospitals. What happens after the procedure?  You may return to your normal, everyday life, including diet, activities, and medicines, unless your health care provider tells you not to do that. Summary  An echocardiogram is a procedure that uses painless sound waves (ultrasound) to produce an image of the heart.  Images from an echocardiogram can provide important information about the size and shape of your heart, heart muscle function, heart valve function, and fluid buildup around your heart.  You do not need to do anything to prepare before this procedure. You may eat and drink normally.  After the echocardiogram is completed, you may return to your normal, everyday life,  unless your health care provider tells you not to do that. This information is not intended to replace advice given to you by your health care provider. Make sure you discuss any questions you have with your health care provider. Document Revised: 05/10/2018 Document Reviewed: 02/20/2016 Elsevier Patient Education  [place].

## 2019-12-10 ENCOUNTER — Ambulatory Visit (INDEPENDENT_AMBULATORY_CARE_PROVIDER_SITE_OTHER): Payer: Managed Care, Other (non HMO)

## 2019-12-10 ENCOUNTER — Other Ambulatory Visit: Payer: Self-pay

## 2019-12-10 ENCOUNTER — Ambulatory Visit
Admission: RE | Admit: 2019-12-10 | Discharge: 2019-12-10 | Disposition: A | Discharge status: Home / Self Care | Payer: Managed Care, Other (non HMO) | Source: Ambulatory Visit | Attending: Cardiology | Admitting: Cardiology

## 2019-12-10 DIAGNOSIS — R0600 Dyspnea, unspecified: Secondary | ICD-10-CM

## 2019-12-10 DIAGNOSIS — R0609 Other forms of dyspnea: Secondary | ICD-10-CM

## 2019-12-10 LAB — ECHOCARDIOGRAM COMPLETE
AR max vel: 5.41 cm2
AV Area VTI: 5.08 cm2
AV Area mean vel: 4.93 cm2
AV Mean grad: 2 mmHg
AV Peak grad: 3 mmHg
Ao pk vel: 0.87 m/s
Area-P 1/2: 2.39 cm2
Calc EF: 46.4 %
S' Lateral: 4.25 cm
Single Plane A2C EF: 46.3 %
Single Plane A4C EF: 49.4 %

## 2019-12-10 LAB — NM MYOCAR MULTI W/SPECT W/WALL MOTION / EF: Peak HR: 90 {beats}/min

## 2019-12-10 MED ORDER — TECHNETIUM TC 99M TETROFOSMIN IV KIT
10.0800 | PACK | Freq: Once | INTRAVENOUS | Status: AC | PRN
  Administered 2019-12-10: 10.08 via INTRAVENOUS

## 2019-12-10 MED ORDER — REGADENOSON 0.4 MG/5ML IV SOLN
0.4000 mg | Freq: Once | INTRAVENOUS | Status: AC
  Administered 2019-12-10: 0.4 mg via INTRAVENOUS

## 2019-12-10 MED ORDER — TECHNETIUM TC 99M TETROFOSMIN IV KIT
29.6910 | PACK | Freq: Once | INTRAVENOUS | Status: AC | PRN
  Administered 2019-12-10: 29.691 via INTRAVENOUS

## 2019-12-11 LAB — NM MYOCAR MULTI W/SPECT W/WALL MOTION / EF
LV dias vol: 161 mL (ref 62–150)
LV sys vol: 85 mL
Percent HR: 56 %
Rest HR: 63 {beats}/min
TID: 1.17

## 2019-12-12 ENCOUNTER — Telehealth: Payer: Self-pay | Admitting: Cardiology

## 2019-12-12 NOTE — Telephone Encounter (Signed)
Patient returning call.

## 2019-12-12 NOTE — Telephone Encounter (Signed)
Spoke with patient and gave him result note for both his Echo and his Myoview. Patient verbalized understanding, agreed with plan, and confirmed that he will be here for his follow up appointment on Monday 12/16/19.

## 2019-12-16 ENCOUNTER — Encounter: Payer: Self-pay | Admitting: Cardiology

## 2019-12-16 ENCOUNTER — Other Ambulatory Visit: Payer: Self-pay

## 2019-12-16 ENCOUNTER — Ambulatory Visit: Payer: Managed Care, Other (non HMO) | Admitting: Cardiology

## 2019-12-16 VITALS — BP 130/78 | HR 86 | Ht 68.0 in | Wt 288.4 lb

## 2019-12-16 DIAGNOSIS — E7800 Pure hypercholesterolemia, unspecified: Secondary | ICD-10-CM

## 2019-12-16 DIAGNOSIS — R0609 Other forms of dyspnea: Secondary | ICD-10-CM

## 2019-12-16 DIAGNOSIS — R0600 Dyspnea, unspecified: Secondary | ICD-10-CM

## 2019-12-16 NOTE — H&P (View-Only) (Signed)
Cardiology Office Note:    Date:  12/16/2019   ID:  Mitchell Lewis, DOB 04/03/1960, MRN [id number]  PCP:  Elby Beck, FNP  [facility] Cardiologist:  Kate Sable, MD  [place] Electrophysiologist:  None   Referring MD: Elby Beck, FNP   Chief Complaint  Patient presents with  . OTHER    F/u echo/myoview c/o sob. Meds reviewed verbally with pt.    History of Present Illness:    Mitchell Lewis is a 59 y.o. male with a hx of hyperlipidemia, obesity who presents for follow-up.  He was last seen due to shortness of breath and neck tightness.  Echocardiogram and Myoview was ordered to evaluate cardiac function and presence of ischemia.  Patient still has shortness of breath with exertion, daytime fatigue, snoring.  No other new complaints at this time.  Prior notes  he is a Nature conservation officer, and has been finding it difficult to do his normal task.  Walking from the parking lot to the office today was difficult.  He denies chest pain.  Denies palpitations.  He endorses snoring, daytime somnolence, fatigue, feeling tired despite adequate time sleeping.  Was recently started on Lipitor for hyperlipidemia.  Denies smoking.  Is working on losing some weight.  Currently started on sertraline due to multiple deaths in the family.  Denies any family history of heart disease.  Past Medical History:  Diagnosis Date  . Colon polyp   . Fatty liver   . History of colon polyps 2018  . Sleep apnea     Past Surgical History:  Procedure Laterality Date  . COLONOSCOPY  01/09/2017  . TMJ ARTHROSCOPY  1993  . VASECTOMY    . WISDOM TOOTH EXTRACTION  03/17/2017    Current Medications: Current Meds  Medication Sig  . atorvastatin (LIPITOR) 20 MG tablet Take 1 tablet (20 mg total) by mouth daily.  . Multiple Vitamin (MULTIVITAMIN) tablet Take 1 tablet by mouth daily.  . Multiple Vitamins-Minerals (ZINC PO) Take by mouth.  . sertraline (ZOLOFT) 50 MG tablet Take 1  tablet (50 mg total) by mouth daily.  Marland Kitchen VITAMIN D PO Take by mouth.     Allergies:   Sulfa antibiotics   Social History   Socioeconomic History  . Marital status: Married    Spouse name: Not on file  . Number of children: 2  . Years of education: Not on file  . Highest education level: Not on file  Occupational History  . Occupation: full time Super  Tobacco Use  . Smoking status: Former Smoker    Packs/day: 1.00    Years: 40.00    Pack years: 40.00    Types: Cigarettes    Quit date: 05/15/2013    Years since quitting: 6.5  . Smokeless tobacco: Never Used  Vaping Use  . Vaping Use: Never used  Substance and Sexual Activity  . Alcohol use: Yes    Alcohol/week: 1.0 standard drink    Types: 1 Standard drinks or equivalent per week  . Drug use: No  . Sexual activity: Not on file  Other Topics Concern  . Not on file  Social History Narrative  . Not on file   Social Determinants of Health   Financial Resource Strain:   . Difficulty of Paying Living Expenses: Not on file  Food Insecurity:   . Worried About Charity fundraiser in the Last Year: Not on file  . Ran Out of Food in the Last Year:  Not on file  Transportation Needs:   . Lack of Transportation (Medical): Not on file  . Lack of Transportation (Non-Medical): Not on file  Physical Activity:   . Days of Exercise per Week: Not on file  . Minutes of Exercise per Session: Not on file  Stress:   . Feeling of Stress : Not on file  Social Connections:   . Frequency of Communication with Friends and Family: Not on file  . Frequency of Social Gatherings with Friends and Family: Not on file  . Attends Religious Services: Not on file  . Active Member of Clubs or Organizations: Not on file  . Attends Archivist Meetings: Not on file  . Marital Status: Not on file     Family History: The patient's family history includes Cancer in his father; Diabetes in his mother; Heart disease in his father; Kidney failure  in his father; Prostate cancer in his paternal grandfather.  ROS:   Please see the history of present illness.     All other systems reviewed and are negative.  EKGs/Labs/Other Studies Reviewed:    The following studies were reviewed today:   EKG:  EKG not ordered today.    Recent Labs: 11/04/2019: ALT 75; BUN 26; Creatinine, Ser 1.04; Potassium 4.2; Sodium 139; TSH 1.90  Recent Lipid Panel    Component Value Date/Time   CHOL 137 11/04/2019 1011   TRIG 152.0 (H) 11/04/2019 1011   HDL 35.10 (L) 11/04/2019 1011   CHOLHDL 4 11/04/2019 1011   VLDL 30.4 11/04/2019 1011   LDLCALC 71 11/04/2019 1011     Risk Assessment/Calculations:      Physical Exam:    VS:  BP 130/78 (BP Location: Left Arm, Patient Position: Sitting, Cuff Size: Normal)   Pulse 86   Ht 5\' 8"  (1.727 m)   Wt 288 lb 6 oz (130.8 kg)   SpO2 96%   BMI 43.85 kg/m     Wt Readings from Last 3 Encounters:  12/16/19 288 lb 6 oz (130.8 kg)  11/25/19 290 lb 8 oz (131.8 kg)  11/04/19 299 lb 12 oz (136 kg)     GEN:  Well nourished, well developed in no acute distress HEENT: Normal NECK: No JVD; No carotid bruits LYMPHATICS: No lymphadenopathy CARDIAC: RRR, no murmurs, rubs, gallops RESPIRATORY:  Clear to auscultation without rales, wheezing or rhonchi  ABDOMEN: Soft, non-tender, distended MUSCULOSKELETAL:  No edema; No deformity  SKIN: Warm and dry NEUROLOGIC:  Alert and oriented x 3 PSYCHIATRIC:  Normal affect   ASSESSMENT:    1. DOE (dyspnea on exertion)   2. Pure hypercholesterolemia   3. Morbid obesity ([place])    PLAN:    In order of problems listed above:  1. Patient with worsening dyspnea on exertion.  Has risk factors of obesity, hyperlipidemia.  Echocardiogram 12/10/2019 showed normal systolic function, impaired relaxation, EF 55 to 60%, mild aortic root dilatation.  Pharmacologic stress test possibly artifactual, versus small apical inferior ischemia.  Plan for left and right heart cath.  If  heart cath is normal,will refer patient to pulmonary medicine for sleep study/OSA eval. 2. History of hyperlipidemia, continue Lipitor 3. Patient is morbidly obese, weight loss advised.  Follow-up left heart cath.   Medication Adjustments/Labs and Tests Ordered: Current medicines are reviewed at length with the patient today.  Concerns regarding medicines are outlined above.  Orders Placed This Encounter  Procedures  . Basic metabolic panel  . CBC   No orders of the defined  types were placed in this encounter.   Patient Instructions  Medication Instructions:   Your physician recommends that you continue on your current medications as directed. Please refer to the Current Medication list given to you today.  *If you need a refill on your cardiac medications before your next appointment, please call your pharmacy*   Lab Work: BMP, CBC drawn today If you have labs (blood work) drawn today and your tests are completely normal, you will receive your results only by: Marland Kitchen MyChart Message (if you have MyChart) OR . A paper copy in the mail If you have any lab test that is abnormal or we need to change your treatment, we will call you to review the results.   Testing/Procedures:  Drive through [employer] car line in front of [place] on Monday 12/23/19 at [place] are scheduled for a Cardiac Cath on:_______Tuesday_11/23/21_________________  Please arrive at __0730_____am on the day of your procedure  Please expect a call from our [place] to pre-register you  Do not eat/drink anything after midnight  Someone will need to drive you home  It is recommended someone be with you for the first 24 hours after your procedure  Wear clothes that are easy to get on/off and wear slip on shoes if possible   Medications bring a current list of all medications with you  _XX__ You may take all of your medications the  morning of your procedure with enough water to swallow safely   Day of your procedure: Arrive at the [facility] entrance.  Free valet service is available.  After entering the [facility] please check-in at the registration desk (1st desk on your right) to receive your armband. After receiving your armband someone will escort you to the cardiac cath/special procedures waiting area.  The usual length of stay after your procedure is about 2 to 3 hours.  This can vary.  If you have any questions, please call our office at [phone], or you may call the cardiac cath lab at [facility] directly at [phone]   Follow-Up: At [facility], you and your health needs are our priority.  As part of our continuing mission to provide you with exceptional heart care, we have created designated Provider Care Teams.  These Care Teams include your primary Cardiologist (physician) and Advanced Practice Providers (APPs -  Physician Assistants and Nurse Practitioners) who all work together to provide you with the care you need, when you need it.  We recommend signing up for the patient portal called "MyChart".  Sign up information is provided on this After Visit Summary.  MyChart is used to connect with patients for Virtual Visits (Telemedicine).  Patients are able to view lab/test results, encounter notes, upcoming appointments, etc.  Non-urgent messages can be sent to your provider as well.   To learn more about what you can do with MyChart, go to [email or web address].    Your next appointment:   2-3 weeks   The format for your next appointment:   In Person  Provider:   Kate Sable, MD   Other Instructions      Signed, Kate Sable, MD  12/16/2019 12:32 PM    [place]

## 2019-12-16 NOTE — Patient Instructions (Signed)
Medication Instructions:   Your physician recommends that you continue on your current medications as directed. Please refer to the Current Medication list given to you today.  *If you need a refill on your cardiac medications before your next appointment, please call your pharmacy*   Lab Work: BMP, CBC drawn today If you have labs (blood work) drawn today and your tests are completely normal, you will receive your results only by: Marland Kitchen MyChart Message (if you have MyChart) OR . A paper copy in the mail If you have any lab test that is abnormal or we need to change your treatment, we will call you to review the results.   Testing/Procedures:  Drive through [employer] car line in front of [place] on Monday 12/23/19 at [place] are scheduled for a Cardiac Cath on:_______Tuesday_11/23/21_________________  Please arrive at __0730_____am on the day of your procedure  Please expect a call from our [place] to pre-register you  Do not eat/drink anything after midnight  Someone will need to drive you home  It is recommended someone be with you for the first 24 hours after your procedure  Wear clothes that are easy to get on/off and wear slip on shoes if possible   Medications bring a current list of all medications with you  _XX__ You may take all of your medications the morning of your procedure with enough water to swallow safely   Day of your procedure: Arrive at the [facility] entrance.  Free valet service is available.  After entering the [facility] please check-in at the registration desk (1st desk on your right) to receive your armband. After receiving your armband someone will escort you to the cardiac cath/special procedures waiting area.  The usual length of stay after your procedure is about 2 to 3 hours.  This can vary.  If you have any questions, please call our office at [phone], or you may  call the cardiac cath lab at [facility] directly at [phone]   Follow-Up: At [facility], you and your health needs are our priority.  As part of our continuing mission to provide you with exceptional heart care, we have created designated Provider Care Teams.  These Care Teams include your primary Cardiologist (physician) and Advanced Practice Providers (APPs -  Physician Assistants and Nurse Practitioners) who all work together to provide you with the care you need, when you need it.  We recommend signing up for the patient portal called "MyChart".  Sign up information is provided on this After Visit Summary.  MyChart is used to connect with patients for Virtual Visits (Telemedicine).  Patients are able to view lab/test results, encounter notes, upcoming appointments, etc.  Non-urgent messages can be sent to your provider as well.   To learn more about what you can do with MyChart, go to [email or web address].    Your next appointment:   2-3 weeks   The format for your next appointment:   In Person  Provider:   Kate Sable, MD   Other Instructions

## 2019-12-16 NOTE — Progress Notes (Signed)
Cardiology Office Note:    Date:  12/16/2019   ID:  Mitchell Lewis, DOB [birth year], MRN [id number]  PCP:  Elby Beck, FNP  [facility] Cardiologist:  Kate Sable, MD  [place] Electrophysiologist:  None   Referring MD: Elby Beck, FNP   Chief Complaint  Patient presents with  . OTHER    F/u echo/myoview c/o sob. Meds reviewed verbally with pt.    History of Present Illness:    Mitchell Lewis is a 59 y.o. male with a hx of hyperlipidemia, obesity who presents for follow-up.  He was last seen due to shortness of breath and neck tightness.  Echocardiogram and Myoview was ordered to evaluate cardiac function and presence of ischemia.  Patient still has shortness of breath with exertion, daytime fatigue, snoring.  No other new complaints at this time.  Prior notes  he is a Nature conservation officer, and has been finding it difficult to do his normal task.  Walking from the parking lot to the office today was difficult.  He denies chest pain.  Denies palpitations.  He endorses snoring, daytime somnolence, fatigue, feeling tired despite adequate time sleeping.  Was recently started on Lipitor for hyperlipidemia.  Denies smoking.  Is working on losing some weight.  Currently started on sertraline due to multiple deaths in the family.  Denies any family history of heart disease.  Past Medical History:  Diagnosis Date  . Colon polyp   . Fatty liver   . History of colon polyps 2018  . Sleep apnea     Past Surgical History:  Procedure Laterality Date  . COLONOSCOPY  01/09/2017  . TMJ ARTHROSCOPY  1993  . VASECTOMY    . WISDOM TOOTH EXTRACTION  03/17/2017    Current Medications: Current Meds  Medication Sig  . atorvastatin (LIPITOR) 20 MG tablet Take 1 tablet (20 mg total) by mouth daily.  . Multiple Vitamin (MULTIVITAMIN) tablet Take 1 tablet by mouth daily.  . Multiple Vitamins-Minerals (ZINC PO) Take by mouth.  . sertraline (ZOLOFT) 50 MG tablet Take 1  tablet (50 mg total) by mouth daily.  Marland Kitchen VITAMIN D PO Take by mouth.     Allergies:   Sulfa antibiotics   Social History   Socioeconomic History  . Marital status: Married    Spouse name: Not on file  . Number of children: 2  . Years of education: Not on file  . Highest education level: Not on file  Occupational History  . Occupation: full time Super  Tobacco Use  . Smoking status: Former Smoker    Packs/day: 1.00    Years: 40.00    Pack years: 40.00    Types: Cigarettes    Quit date: 05/15/2013    Years since quitting: 6.5  . Smokeless tobacco: Never Used  Vaping Use  . Vaping Use: Never used  Substance and Sexual Activity  . Alcohol use: Yes    Alcohol/week: 1.0 standard drink    Types: 1 Standard drinks or equivalent per week  . Drug use: No  . Sexual activity: Not on file  Other Topics Concern  . Not on file  Social History Narrative  . Not on file   Social Determinants of Health   Financial Resource Strain:   . Difficulty of Paying Living Expenses: Not on file  Food Insecurity:   . Worried About Charity fundraiser in the Last Year: Not on file  . Ran Out of Food in the Last Year:  Not on file  Transportation Needs:   . Lack of Transportation (Medical): Not on file  . Lack of Transportation (Non-Medical): Not on file  Physical Activity:   . Days of Exercise per Week: Not on file  . Minutes of Exercise per Session: Not on file  Stress:   . Feeling of Stress : Not on file  Social Connections:   . Frequency of Communication with Friends and Family: Not on file  . Frequency of Social Gatherings with Friends and Family: Not on file  . Attends Religious Services: Not on file  . Active Member of Clubs or Organizations: Not on file  . Attends Archivist Meetings: Not on file  . Marital Status: Not on file     Family History: The patient's family history includes Cancer in his father; Diabetes in his mother; Heart disease in his father; Kidney failure  in his father; Prostate cancer in his paternal grandfather.  ROS:   Please see the history of present illness.     All other systems reviewed and are negative.  EKGs/Labs/Other Studies Reviewed:    The following studies were reviewed today:   EKG:  EKG not ordered today.    Recent Labs: 11/04/2019: ALT 75; BUN 26; Creatinine, Ser 1.04; Potassium 4.2; Sodium 139; TSH 1.90  Recent Lipid Panel    Component Value Date/Time   CHOL 137 11/04/2019 1011   TRIG 152.0 (H) 11/04/2019 1011   HDL 35.10 (L) 11/04/2019 1011   CHOLHDL 4 11/04/2019 1011   VLDL 30.4 11/04/2019 1011   LDLCALC 71 11/04/2019 1011     Risk Assessment/Calculations:      Physical Exam:    VS:  BP 130/78 (BP Location: Left Arm, Patient Position: Sitting, Cuff Size: Normal)   Pulse 86   Ht 5\' 8"  (1.727 m)   Wt 288 lb 6 oz (130.8 kg)   SpO2 96%   BMI 43.85 kg/m     Wt Readings from Last 3 Encounters:  12/16/19 288 lb 6 oz (130.8 kg)  11/25/19 290 lb 8 oz (131.8 kg)  11/04/19 299 lb 12 oz (136 kg)     GEN:  Well nourished, well developed in no acute distress HEENT: Normal NECK: No JVD; No carotid bruits LYMPHATICS: No lymphadenopathy CARDIAC: RRR, no murmurs, rubs, gallops RESPIRATORY:  Clear to auscultation without rales, wheezing or rhonchi  ABDOMEN: Soft, non-tender, distended MUSCULOSKELETAL:  No edema; No deformity  SKIN: Warm and dry NEUROLOGIC:  Alert and oriented x 3 PSYCHIATRIC:  Normal affect   ASSESSMENT:    1. DOE (dyspnea on exertion)   2. Pure hypercholesterolemia   3. Morbid obesity ([place])    PLAN:    In order of problems listed above:  1. Patient with worsening dyspnea on exertion.  Has risk factors of obesity, hyperlipidemia.  Echocardiogram 12/10/2019 showed normal systolic function, impaired relaxation, EF 55 to 60%, mild aortic root dilatation.  Pharmacologic stress test possibly artifactual, versus small apical inferior ischemia.  Plan for left and right heart cath.  If  heart cath is normal,will refer patient to pulmonary medicine for sleep study/OSA eval. 2. History of hyperlipidemia, continue Lipitor 3. Patient is morbidly obese, weight loss advised.  Follow-up left heart cath.   Medication Adjustments/Labs and Tests Ordered: Current medicines are reviewed at length with the patient today.  Concerns regarding medicines are outlined above.  Orders Placed This Encounter  Procedures  . Basic metabolic panel  . CBC   No orders of the defined  types were placed in this encounter.   Patient Instructions  Medication Instructions:   Your physician recommends that you continue on your current medications as directed. Please refer to the Current Medication list given to you today.  *If you need a refill on your cardiac medications before your next appointment, please call your pharmacy*   Lab Work: BMP, CBC drawn today If you have labs (blood work) drawn today and your tests are completely normal, you will receive your results only by: Marland Kitchen MyChart Message (if you have MyChart) OR . A paper copy in the mail If you have any lab test that is abnormal or we need to change your treatment, we will call you to review the results.   Testing/Procedures:  Drive through [employer] car line in front of [place] on Monday 12/23/19 at [place] are scheduled for a Cardiac Cath on:_______Tuesday_11/23/21_________________  Please arrive at __0730_____am on the day of your procedure  Please expect a call from our [place] to pre-register you  Do not eat/drink anything after midnight  Someone will need to drive you home  It is recommended someone be with you for the first 24 hours after your procedure  Wear clothes that are easy to get on/off and wear slip on shoes if possible   Medications bring a current list of all medications with you  _XX__ You may take all of your medications the  morning of your procedure with enough water to swallow safely   Day of your procedure: Arrive at the [facility] entrance.  Free valet service is available.  After entering the [facility] please check-in at the registration desk (1st desk on your right) to receive your armband. After receiving your armband someone will escort you to the cardiac cath/special procedures waiting area.  The usual length of stay after your procedure is about 2 to 3 hours.  This can vary.  If you have any questions, please call our office at [phone], or you may call the cardiac cath lab at [facility] directly at [phone]   Follow-Up: At [facility], you and your health needs are our priority.  As part of our continuing mission to provide you with exceptional heart care, we have created designated Provider Care Teams.  These Care Teams include your primary Cardiologist (physician) and Advanced Practice Providers (APPs -  Physician Assistants and Nurse Practitioners) who all work together to provide you with the care you need, when you need it.  We recommend signing up for the patient portal called "MyChart".  Sign up information is provided on this After Visit Summary.  MyChart is used to connect with patients for Virtual Visits (Telemedicine).  Patients are able to view lab/test results, encounter notes, upcoming appointments, etc.  Non-urgent messages can be sent to your provider as well.   To learn more about what you can do with MyChart, go to [email or web address].    Your next appointment:   2-3 weeks   The format for your next appointment:   In Person  Provider:   Kate Sable, MD   Other Instructions      Signed, Kate Sable, MD  12/16/2019 12:32 PM    [place]

## 2019-12-17 LAB — BASIC METABOLIC PANEL
BUN/Creatinine Ratio: 18 (ref 10–24)
BUN: 17 mg/dL (ref 8–27)
CO2: 19 mmol/L — ABNORMAL LOW (ref 20–29)
Calcium: 9.4 mg/dL (ref 8.6–10.2)
Chloride: 103 mmol/L (ref 96–106)
Creatinine, Ser: 0.93 mg/dL (ref 0.76–1.27)
GFR calc Af Amer: 102 mL/min/{1.73_m2} (ref >59)
GFR calc non Af Amer: 88 mL/min/{1.73_m2} (ref >59)
Glucose: 122 mg/dL — ABNORMAL HIGH (ref 65–99)
Potassium: 4.6 mmol/L (ref 3.5–5.2)
Sodium: 137 mmol/L (ref 134–144)

## 2019-12-17 LAB — CBC
Hematocrit: 51.8 % — ABNORMAL HIGH (ref 37.5–51.0)
Hemoglobin: 17.4 g/dL (ref 13.0–17.7)
MCH: 32.5 pg (ref 26.6–33.0)
MCHC: 33.6 g/dL (ref 31.5–35.7)
MCV: 97 fL (ref 79–97)
Platelets: 270 10*3/uL (ref 150–450)
RBC: 5.36 x10E6/uL (ref 4.14–5.80)
RDW: 12.2 % (ref 11.6–15.4)
WBC: 9.3 10*3/uL (ref 3.4–10.8)

## 2019-12-23 ENCOUNTER — Other Ambulatory Visit
Admission: RE | Admit: 2019-12-23 | Discharge: 2019-12-23 | Disposition: A | Discharge status: Home / Self Care | Payer: Managed Care, Other (non HMO) | Source: Ambulatory Visit | Attending: Cardiology | Admitting: Cardiology

## 2019-12-23 ENCOUNTER — Other Ambulatory Visit: Payer: Self-pay

## 2019-12-23 DIAGNOSIS — Z20822 Contact with and (suspected) exposure to covid-19: Secondary | ICD-10-CM | POA: Insufficient documentation

## 2019-12-23 DIAGNOSIS — Z01812 Encounter for preprocedural laboratory examination: Secondary | ICD-10-CM | POA: Diagnosis not present

## 2019-12-23 LAB — SARS CORONAVIRUS 2 (TAT 6-24 HRS): SARS Coronavirus 2: NEGATIVE

## 2019-12-24 ENCOUNTER — Encounter: Payer: Self-pay | Admitting: Anesthesiology

## 2019-12-24 ENCOUNTER — Encounter
Admission: RE | Disposition: A | Discharge status: Home / Self Care | Payer: Self-pay | Source: Home / Self Care | Attending: Internal Medicine

## 2019-12-24 ENCOUNTER — Other Ambulatory Visit: Payer: Self-pay

## 2019-12-24 ENCOUNTER — Ambulatory Visit
Admission: RE | Admit: 2019-12-24 | Discharge: 2019-12-24 | Disposition: A | Discharge status: Home / Self Care | Payer: Managed Care, Other (non HMO) | Attending: Internal Medicine | Admitting: Internal Medicine

## 2019-12-24 DIAGNOSIS — R0609 Other forms of dyspnea: Secondary | ICD-10-CM | POA: Diagnosis not present

## 2019-12-24 DIAGNOSIS — E7800 Pure hypercholesterolemia, unspecified: Secondary | ICD-10-CM | POA: Diagnosis not present

## 2019-12-24 DIAGNOSIS — Z882 Allergy status to sulfonamides status: Secondary | ICD-10-CM | POA: Diagnosis not present

## 2019-12-24 DIAGNOSIS — E785 Hyperlipidemia, unspecified: Secondary | ICD-10-CM | POA: Diagnosis not present

## 2019-12-24 DIAGNOSIS — R9439 Abnormal result of other cardiovascular function study: Secondary | ICD-10-CM | POA: Diagnosis not present

## 2019-12-24 DIAGNOSIS — Z87891 Personal history of nicotine dependence: Secondary | ICD-10-CM | POA: Diagnosis not present

## 2019-12-24 DIAGNOSIS — Z6841 Body Mass Index (BMI) 40.0 and over, adult: Secondary | ICD-10-CM | POA: Diagnosis not present

## 2019-12-24 DIAGNOSIS — R0600 Dyspnea, unspecified: Secondary | ICD-10-CM

## 2019-12-24 DIAGNOSIS — Z79899 Other long term (current) drug therapy: Secondary | ICD-10-CM | POA: Insufficient documentation

## 2019-12-24 HISTORY — PX: RIGHT/LEFT HEART CATH AND CORONARY ANGIOGRAPHY: CATH118266

## 2019-12-24 SURGERY — RIGHT/LEFT HEART CATH AND CORONARY ANGIOGRAPHY
Anesthesia: Moderate Sedation | Laterality: Bilateral

## 2019-12-24 MED ORDER — ONDANSETRON HCL 4 MG/2ML IJ SOLN: 4.0000 mg | Freq: Four times a day (QID) | INTRAMUSCULAR | Status: DC | PRN

## 2019-12-24 MED ORDER — SODIUM CHLORIDE 0.9 % IV SOLN: 250.0000 mL | INTRAVENOUS | Status: DC | PRN

## 2019-12-24 MED ORDER — SODIUM CHLORIDE 0.9% FLUSH: 3.0000 mL | Freq: Two times a day (BID) | INTRAVENOUS | Status: DC

## 2019-12-24 MED ORDER — IOHEXOL 300 MG/ML  SOLN
INTRAMUSCULAR | Status: DC | PRN
  Administered 2019-12-24: 70 mL

## 2019-12-24 MED ORDER — SODIUM CHLORIDE 0.9 % WEIGHT BASED INFUSION: 1.0000 mL/kg/h | INTRAVENOUS | Status: DC

## 2019-12-24 MED ORDER — LIDOCAINE HCL (PF) 1 % IJ SOLN
INTRAMUSCULAR | Status: DC | PRN
  Administered 2019-12-24: 5 mL

## 2019-12-24 MED ORDER — HEPARIN (PORCINE) IN NACL 1000-0.9 UT/500ML-% IV SOLN
INTRAVENOUS | Status: AC
  Filled 2019-12-24: qty 1000

## 2019-12-24 MED ORDER — SODIUM CHLORIDE 0.9 % IV SOLN: INTRAVENOUS | Status: DC

## 2019-12-24 MED ORDER — HEPARIN SODIUM (PORCINE) 1000 UNIT/ML IJ SOLN
INTRAMUSCULAR | Status: DC | PRN
  Administered 2019-12-24: 5000 [IU] via INTRAVENOUS

## 2019-12-24 MED ORDER — SODIUM CHLORIDE 0.9% FLUSH: 3.0000 mL | INTRAVENOUS | Status: DC | PRN

## 2019-12-24 MED ORDER — VERAPAMIL HCL 2.5 MG/ML IV SOLN
INTRAVENOUS | Status: AC
  Filled 2019-12-24: qty 2

## 2019-12-24 MED ORDER — HEPARIN SODIUM (PORCINE) 1000 UNIT/ML IJ SOLN
INTRAMUSCULAR | Status: AC
  Filled 2019-12-24: qty 1

## 2019-12-24 MED ORDER — FENTANYL CITRATE (PF) 100 MCG/2ML IJ SOLN
INTRAMUSCULAR | Status: DC | PRN
  Administered 2019-12-24: 25 ug via INTRAVENOUS

## 2019-12-24 MED ORDER — MIDAZOLAM HCL 2 MG/2ML IJ SOLN
INTRAMUSCULAR | Status: DC | PRN
  Administered 2019-12-24: 1 mg via INTRAVENOUS

## 2019-12-24 MED ORDER — SODIUM CHLORIDE 0.9 % WEIGHT BASED INFUSION: 3.0000 mL/kg/h | INTRAVENOUS | Status: AC

## 2019-12-24 MED ORDER — ACETAMINOPHEN 325 MG PO TABS: 650.0000 mg | ORAL_TABLET | ORAL | Status: DC | PRN

## 2019-12-24 MED ORDER — MIDAZOLAM HCL 2 MG/2ML IJ SOLN
INTRAMUSCULAR | Status: AC
  Filled 2019-12-24: qty 2

## 2019-12-24 MED ORDER — VERAPAMIL HCL 2.5 MG/ML IV SOLN
INTRAVENOUS | Status: DC | PRN
  Administered 2019-12-24: 2.5 mg via INTRA_ARTERIAL

## 2019-12-24 MED ORDER — LABETALOL HCL 5 MG/ML IV SOLN: 10.0000 mg | INTRAVENOUS | Status: DC | PRN

## 2019-12-24 MED ORDER — FENTANYL CITRATE (PF) 100 MCG/2ML IJ SOLN
INTRAMUSCULAR | Status: AC
  Filled 2019-12-24: qty 2

## 2019-12-24 MED ORDER — HEPARIN (PORCINE) IN NACL 1000-0.9 UT/500ML-% IV SOLN
INTRAVENOUS | Status: DC | PRN
  Administered 2019-12-24: 500 mL

## 2019-12-24 MED ORDER — LIDOCAINE HCL (PF) 1 % IJ SOLN
INTRAMUSCULAR | Status: AC
  Filled 2019-12-24: qty 30

## 2019-12-24 MED ORDER — HYDRALAZINE HCL 20 MG/ML IJ SOLN: 10.0000 mg | INTRAMUSCULAR | Status: DC | PRN

## 2019-12-24 SURGICAL SUPPLY — 11 items
CATH BALLN WEDGE 5F 110CM (CATHETERS) ×2 IMPLANT
CATH INFINITI 5 FR JL3.5 (CATHETERS) ×2 IMPLANT
CATH INFINITI JR4 5F (CATHETERS) ×2 IMPLANT
DEVICE RAD COMP TR BAND LRG (VASCULAR PRODUCTS) ×2 IMPLANT
GLIDESHEATH SLEND SS 6F .021 (SHEATH) ×2 IMPLANT
GUIDEWIRE INQWIRE 1.5J.035X260 (WIRE) ×1 IMPLANT
INQWIRE 1.5J .035X260CM (WIRE) ×2
KIT MANI 3VAL PERCEP (MISCELLANEOUS) ×2 IMPLANT
PACK CARDIAC CATH (CUSTOM PROCEDURE TRAY) ×2 IMPLANT
SHEATH GLIDE SLENDER 4/5FR (SHEATH) ×2 IMPLANT
WIRE NITINOL .018 (WIRE) ×2 IMPLANT

## 2019-12-24 NOTE — Progress Notes (Signed)
Pt resting comfortable in room, wife at bedside.  Pt tolerated food and drink.  VSS, pt alert and oriented.

## 2019-12-24 NOTE — Brief Op Note (Signed)
BRIEF CARDIAC CATH NOTE  12/24/2019  10:06 AM  PATIENT:  Mitchell Lewis  59 y.o. male  PRE-OPERATIVE DIAGNOSIS: Dyspnea on exertion and abnormal stress test  POST-OPERATIVE DIAGNOSIS: Same  PROCEDURE:  Procedure(s): RIGHT/LEFT HEART CATH AND CORONARY ANGIOGRAPHY (Bilateral)  SURGEON:  Surgeon(s) and Role:    * Dreyden Rohrman, MD - Primary  FINDINGS: 1. Mild luminal irregularities without angiographically significant coronary artery disease. 2. Normal left and right heart filling pressures. 3. Normal Fick cardiac output. 4. Mild LVOT and RVOT gradients.  RECOMMENDATIONS: 1. Primary prevention of coronary artery disease. 2. Ongoing work-up of dyspnea on exertion per Dr. Garen Lah and Ms. Carlean Purl.  Nelva Bush, MD [facility]

## 2019-12-24 NOTE — Progress Notes (Signed)
Pt dressing c/d/i, vss, IV removed X 2, d/c instructions reviewed with pt and pt wife, both verbalized understanding without any further questions.  Pt was transferred to person vehicle with wife.  Pt ambulated from wheelchair to car without difficulty.

## 2019-12-24 NOTE — Interval H&P Note (Signed)
History and Physical Interval Note:  12/24/2019 9:13 AM  [place] Lyn[place] Dawley  has presented today for surgery, with the diagnosis of dyspnea on exertion and abnormal stress test  The various methods of treatment have been discussed with the patient and family. After consideration of risks, benefits and other options for treatment, the patient has consented to  Procedure(s): RIGHT/LEFT HEART CATH AND CORONARY ANGIOGRAPHY (Bilateral) as a surgical intervention.  The patient's history has been reviewed, patient examined, no change in status, stable for surgery.  I have reviewed the patient's chart and labs.  Questions were answered to the patient's satisfaction.    Cath Lab Visit (complete for each Cath Lab visit)  Clinical Evaluation Leading to the Procedure:   ACS: No.  Non-ACS:    Anginal Classification: CCS III  Anti-ischemic medical therapy: No Therapy  Non-Invasive Test Results: Intermediate-risk stress test findings: cardiac mortality 1-3%/year  Prior CABG: No previous CABG  Raynard Mapps

## 2019-12-24 NOTE — Discharge Instructions (Signed)
Radial Site Care Refer to this sheet in the next few weeks. These instructions provide you with information about caring for yourself after your procedure. Your health care provider may also give you more specific instructions. Your treatment has been planned according to current medical practices, but problems sometimes occur. Call your health care provider if you have any problems or questions after your procedure. What can I expect after the procedure? After your procedure, it is typical to have the following: Bruising at the radial site that usually fades within 1-2 weeks. Blood collecting in the tissue (hematoma) that may be painful to the touch. It should usually decrease in size and tenderness within 1-2 weeks.  Follow these instructions at home: Take medicines only as directed by your health care provider. If you are on a medication called Metformin please do not take for 48 hours after your procedure. Over the next 48hrs please increase your fluid intake of water and non caffeine beverages to flush the contrast dye out of your system.  You may shower 24 hours after the procedure  Leave your bandage on and gently wash the site with plain soap and water. Pat the area dry with a clean towel. Do not rub the site, because this may cause bleeding.  Remove your dressing 48hrs after your procedure and leave open to air.  Do not submerge your site in water for 7 days. This includes swimming and washing dishes.  Check your insertion site every day for redness, swelling, or drainage. Do not apply powder or lotion to the site. Do not flex or bend the affected arm for 24 hours or as directed by your health care provider. Do not push or pull heavy objects with the affected arm for 24 hours or as directed by your health care provider. Do not lift over 10 lb (4.5 kg) for 5 days after your procedure or as directed by your health care provider. Ask your health care provider when it is okay to: Return to  work or school. Resume usual physical activities or sports. Resume sexual activity. Do not drive home if you are discharged the same day as the procedure. Have someone else drive you. You may drive 48 hours after the procedure Do not operate machinery or power tools for 24 hours after the procedure. If your procedure was done as an outpatient procedure, which means that you went home the same day as your procedure, a responsible adult should be with you for the first 24 hours after you arrive home. Keep all follow-up visits as directed by your health care provider. This is important. Contact a health care provider if: You have a fever. You have chills. You have increased bleeding from the radial site. Hold pressure on the site. Get help right away if: You have unusual pain at the radial site. You have redness, warmth, or swelling at the radial site. You have drainage (other than a small amount of blood on the dressing) from the radial site. The radial site is bleeding, and the bleeding does not stop after 15 minutes of holding steady pressure on the site. Your arm or hand becomes pale, cool, tingly, or numb. This information is not intended to replace advice given to you by your health care provider. Make sure you discuss any questions you have with your health care provider. Document Released: 02/19/2010 Document Revised: 06/25/2015 Document Reviewed: 08/05/2013 Elsevier Interactive Patient Education  2018 Elsevier Inc.  

## 2019-12-24 NOTE — Progress Notes (Signed)
TR band removed, no signs of bleeding noted.  Pressure dressing applied.

## 2019-12-25 ENCOUNTER — Encounter: Payer: Self-pay | Admitting: Internal Medicine

## 2019-12-30 ENCOUNTER — Encounter: Payer: Self-pay | Admitting: Family Medicine

## 2019-12-30 ENCOUNTER — Ambulatory Visit (INDEPENDENT_AMBULATORY_CARE_PROVIDER_SITE_OTHER): Payer: Managed Care, Other (non HMO) | Admitting: Family Medicine

## 2019-12-30 ENCOUNTER — Other Ambulatory Visit: Payer: Self-pay

## 2019-12-30 VITALS — BP 124/84 | HR 72 | Temp 97.3°F | Ht 68.0 in | Wt 296.0 lb

## 2019-12-30 DIAGNOSIS — R0600 Dyspnea, unspecified: Secondary | ICD-10-CM

## 2019-12-30 DIAGNOSIS — F419 Anxiety disorder, unspecified: Secondary | ICD-10-CM

## 2019-12-30 DIAGNOSIS — Z125 Encounter for screening for malignant neoplasm of prostate: Secondary | ICD-10-CM | POA: Diagnosis not present

## 2019-12-30 DIAGNOSIS — F32A Depression, unspecified: Secondary | ICD-10-CM

## 2019-12-30 DIAGNOSIS — N401 Enlarged prostate with lower urinary tract symptoms: Secondary | ICD-10-CM

## 2019-12-30 DIAGNOSIS — R0609 Other forms of dyspnea: Secondary | ICD-10-CM

## 2019-12-30 DIAGNOSIS — R351 Nocturia: Secondary | ICD-10-CM

## 2019-12-30 DIAGNOSIS — Z6841 Body Mass Index (BMI) 40.0 and over, adult: Secondary | ICD-10-CM

## 2019-12-30 LAB — PSA: PSA: 1.68 ng/mL (ref 0.10–4.00)

## 2019-12-30 NOTE — Patient Instructions (Signed)
Good to see you today  I have put in a referral for you to see a pulmonologist for your shortness of breath, work up for sleep apnea  I want to hold off on starting a prostate medication until you see pulmonary, several of the medications can also affect the lungs.   Please follow up with new provider in 3 months

## 2019-12-30 NOTE — Progress Notes (Signed)
Subjective:    Patient ID: Mitchell Lewis, male    DOB: August 08, 1960, 59 y.o.   MRN: [id number]  HPI Chief Complaint  Patient presents with  . 8 Week Follow-up Prediabetes    With wife today.    This is a 59 yo male who presents today for follow up of DOE, obesity, prediabetes, depression. He is accompanied by his wife.   Was seen 11/04/2019 and referred to cardiology for DOE, neck tightness. Has had negative cardiac cath, nml ECHO. Needs referral to pulmonary per cardiology notes. Do not see pulmonary referral in EMR.   Obesity/ prediabetes- has cut down on soda/ diet soda. Eating 3 meals a day, little snacking.   Depression- was started on sertraline 50 mg 8 weeks ago for worsening depression in setting of multiple deaths of family members worry about his health. Has bone better on sertraline. Sleeping better but continues to have nocturia.   Nocturia- 3-5 times per night, some weakness of stream, slower to empty bladder. Nocturia with variable amounts. Stops drinking liquids at dinner time. Saw urology and was on tamsulosin for about 2 months for BPH, felt like it helped at first and then made things worse. Does not want to return to prior urology practice.     Review of Systems Per HPI    Objective:   Physical Exam Physical Exam  Constitutional: Oriented to person, place, and time. Appears well-developed and well-nourished. Obese. HENT:  Head: Normocephalic and atraumatic.  Eyes: Conjunctivae are normal.  Neck: Normal range of motion. Neck supple.  Cardiovascular: Normal rate, regular rhythm and normal heart sounds.   Pulmonary/Chest: Effort normal and breath sounds normal.  Musculoskeletal: No lower extremity edema.   Neurological: Alert and oriented to person, place, and time.  Skin: Skin is warm and dry.  Psychiatric: Normal mood and affect. Behavior is normal. Judgment and thought content normal.  Vitals reviewed.     BP 124/84 (BP Location: Left Arm, Patient  Position: Sitting, Cuff Size: Large)   Pulse 72   Temp (!) 97.3 F (36.3 C)   Ht 5\' 8"  (1.727 m)   Wt 296 lb (134.3 kg)   SpO2 97%   BMI 45.01 kg/m  Wt Readings from Last 3 Encounters:  12/30/19 296 lb (134.3 kg)  12/24/19 286 lb 9.6 oz (130 kg)  12/16/19 288 lb 6 oz (130.8 kg)   Oxygen saturation with ambulation- ranged from 89-93%  Depression screen [facility] 2/9 12/30/2019 11/04/2019 03/01/2017 02/16/2017  Decreased Interest 1 2 1  0  Down, Depressed, Hopeless 0 2 1 0  PHQ - 2 Score 1 4 2  0  Altered sleeping 1 3 2  -  Tired, decreased energy 3 3 2  -  Change in appetite 1 3 1  -  Feeling bad or failure about yourself  0 0 0 -  Trouble concentrating 0 - 0 -  Moving slowly or fidgety/restless 0 0 0 -  Suicidal thoughts 0 0 0 -  PHQ-9 Score 6 13 7  -  Difficult doing work/chores Somewhat difficult Somewhat difficult Somewhat difficult -       Assessment & Plan:  1. DOE (dyspnea on exertion) - Some decreased oxygen saturation with ambulation, will refer to pulmonary - Ambulatory referral to Pulmonology  2. Screening for prostate cancer - PSA  3. Class 3 severe obesity due to excess calories without serious comorbidity with body mass index (BMI) of 45.0 to 49.9 in adult [facility]) - advised to continue to make dietary improvements - Ambulatory  referral to Pulmonology  4. Anxiety and depression - improved, continue sertraline 50 mg  5. Benign prostatic hyperplasia with nocturia - reviewed urology note from 2019, will hold off and starting medication until pulmonary appointment. May need referral to different urologist.   This visit occurred during the SARS-CoV-2 public health emergency.  Safety protocols were in place, including screening questions prior to the visit, additional usage of staff PPE, and extensive cleaning of exam room while observing appropriate contact time as indicated for disinfecting solutions.      Clarene Reamer, FNP-BC  [facility],  [facility]  12/30/2019 8:26 AM

## 2020-01-13 ENCOUNTER — Other Ambulatory Visit: Payer: Self-pay

## 2020-01-13 ENCOUNTER — Ambulatory Visit: Payer: Managed Care, Other (non HMO) | Admitting: Cardiology

## 2020-01-13 ENCOUNTER — Encounter: Payer: Self-pay | Admitting: Cardiology

## 2020-01-13 VITALS — BP 138/90 | HR 80 | Ht 68.0 in | Wt 292.0 lb

## 2020-01-13 DIAGNOSIS — G4733 Obstructive sleep apnea (adult) (pediatric): Secondary | ICD-10-CM | POA: Diagnosis not present

## 2020-01-13 DIAGNOSIS — R0683 Snoring: Secondary | ICD-10-CM

## 2020-01-13 DIAGNOSIS — R0609 Other forms of dyspnea: Secondary | ICD-10-CM

## 2020-01-13 DIAGNOSIS — R0600 Dyspnea, unspecified: Secondary | ICD-10-CM | POA: Diagnosis not present

## 2020-01-13 NOTE — Progress Notes (Signed)
Cardiology Office Note:    Date:  01/13/2020   ID:  Mitchell Lewis, DOB 23-Jun-1960, MRN [id number]  PCP:  Elby Beck, FNP  [facility] Cardiologist:  Kate Sable, MD  [place] Electrophysiologist:  None   Referring MD: Elby Beck, FNP   Chief Complaint  Patient presents with  . Other    2-3 wk f/u DOE. Meds reviewed verbally with pt.    History of Present Illness:    Mitchell Lewis is a 59 y.o. male with a hx of hyperlipidemia, obesity, former smoker x30+ years who presents for follow-up.  Previously seen due to shortness of breath and neck tightness.  Echo showed preserved EF, Lexiscan had questionable defect.  Underwent left heart cath showing luminal irregularities without any significant obstructive CAD.  Patient still has shortness of breath, endorses snoring, daytime fatigue.  Prior notes Echo 80/9983 normal systolic function, impaired relaxation, mild aortic root dilatation, EF 55 to 60%. Lexiscan Myoview with possible apical inferior defect partially reversible, versus artifact. Left heart cath 12/2019 with minimal luminal irregularities without significant CAD.    He endorses snoring, daytime somnolence, fatigue, feeling tired despite adequate time sleeping.  Was recently started on Lipitor for hyperlipidemia.  Denies smoking.  Is working on losing some weight.   started on sertraline due to multiple deaths in the family.  Denies any family history of heart disease.  Past Medical History:  Diagnosis Date  . Colon polyp   . Fatty liver   . History of colon polyps 2018  . Sleep apnea     Past Surgical History:  Procedure Laterality Date  . COLONOSCOPY  01/09/2017  . RIGHT/LEFT HEART CATH AND CORONARY ANGIOGRAPHY Bilateral 12/24/2019   Procedure: RIGHT/LEFT HEART CATH AND CORONARY ANGIOGRAPHY;  Surgeon: Nelva Bush, MD;  Location: [facility];  Service: Cardiovascular;  Laterality: Bilateral;  . TMJ ARTHROSCOPY  1993  .  VASECTOMY    . WISDOM TOOTH EXTRACTION  03/17/2017    Current Medications: Current Meds  Medication Sig  . atorvastatin (LIPITOR) 20 MG tablet Take 1 tablet (20 mg total) by mouth daily.  . Cholecalciferol (DIALYVITE VITAMIN D 5000) 125 MCG (5000 UT) capsule Take 5,000 Units by mouth daily.  . diphenhydrAMINE (BENADRYL) 25 MG tablet Take 25 mg by mouth at bedtime as needed for sleep.  Marland Kitchen ibuprofen (ADVIL) 200 MG tablet Take 200-400 mg by mouth every 6 (six) hours as needed for headache or moderate pain.  . Menthol, Topical Analgesic, (BIOFREEZE EX) Apply 1 patch topically daily as needed (pain).   . Pediatric Multivitamins-Iron (FLINTSTONES COMPLETE PO) Take 1 tablet by mouth daily.  . sertraline (ZOLOFT) 50 MG tablet Take 1 tablet (50 mg total) by mouth daily.  Marland Kitchen zinc gluconate 50 MG tablet Take 50 mg by mouth daily.     Allergies:   Other and Sulfa antibiotics   Social History   Socioeconomic History  . Marital status: Married    Spouse name: Not on file  . Number of children: 2  . Years of education: Not on file  . Highest education level: Not on file  Occupational History  . Occupation: full time Super  Tobacco Use  . Smoking status: Former Smoker    Packs/day: 1.00    Years: 40.00    Pack years: 40.00    Types: Cigarettes    Quit date: 05/15/2013    Years since quitting: 6.6  . Smokeless tobacco: Never Used  Vaping Use  .  Vaping Use: Never used  Substance and Sexual Activity  . Alcohol use: Yes    Alcohol/week: 1.0 standard drink    Types: 1 Standard drinks or equivalent per week  . Drug use: No  . Sexual activity: Not on file  Other Topics Concern  . Not on file  Social History Narrative  . Not on file   Social Determinants of Health   Financial Resource Strain: Not on file  Food Insecurity: Not on file  Transportation Needs: Not on file  Physical Activity: Not on file  Stress: Not on file  Social Connections: Not on file     Family History: The  patient's family history includes Cancer in his father; Diabetes in his mother; Heart disease in his father; Kidney failure in his father; Prostate cancer in his paternal grandfather.  ROS:   Please see the history of present illness.     All other systems reviewed and are negative.  EKGs/Labs/Other Studies Reviewed:    The following studies were reviewed today:   EKG:  EKG is ordered today.  EKG shows normal sinus rhythm  Recent Labs: 11/04/2019: ALT 75; TSH 1.90 12/16/2019: BUN 17; Creatinine, Ser 0.93; Hemoglobin 17.4; Platelets 270; Potassium 4.6; Sodium 137  Recent Lipid Panel    Component Value Date/Time   CHOL 137 11/04/2019 1011   TRIG 152.0 (H) 11/04/2019 1011   HDL 35.10 (L) 11/04/2019 1011   CHOLHDL 4 11/04/2019 1011   VLDL 30.4 11/04/2019 1011   LDLCALC 71 11/04/2019 1011     Risk Assessment/Calculations:      Physical Exam:    VS:  BP 138/90 (BP Location: Left Arm, Patient Position: Sitting, Cuff Size: Large)   Pulse 80   Ht 5\' 8"  (1.727 m)   Wt 292 lb (132.5 kg)   SpO2 97%   BMI 44.40 kg/m     Wt Readings from Last 3 Encounters:  01/13/20 292 lb (132.5 kg)  12/30/19 296 lb (134.3 kg)  12/24/19 286 lb 9.6 oz (130 kg)     GEN:  Well nourished, well developed in no acute distress HEENT: Normal NECK: No JVD; No carotid bruits LYMPHATICS: No lymphadenopathy CARDIAC: RRR, no murmurs, rubs, gallops RESPIRATORY:  Clear to auscultation without rales, wheezing or rhonchi  ABDOMEN: Soft, non-tender, distended MUSCULOSKELETAL:  No edema; No deformity  SKIN: Warm and dry NEUROLOGIC:  Alert and oriented x 3 PSYCHIATRIC:  Normal affect   ASSESSMENT:    1. DOE (dyspnea on exertion)   2. Morbid obesity ([place])   3. Snoring   4. Severe obstructive sleep apnea-hypopnea syndrome    PLAN:    In order of problems listed above:  1. dyspnea on exertion.  Has risk factors of obesity, hyperlipidemia.  Echocardiogram 12/10/2019 showed normal systolic function,  impaired relaxation, EF 55 to 60%, mild aortic root dilatation.  Left heart cath with luminal irregularities, no obstructive CAD.  Etiology for shortness of breath multifactorial including obesity, prior smoker, possible sleep apnea. 2. morbid obesity, weight loss advised.  Deconditioning could be a contributing factor to her shortness of breath. 3. Patient endorsed snoring, daytime somnolence.  Refer to pulmonary medicine for OSA eval.  Also recommend PFTs due to prior history of smoking x30+ years.  Follow-up in 6-12 months   Medication Adjustments/Labs and Tests Ordered: Current medicines are reviewed at length with the patient today.  Concerns regarding medicines are outlined above.  Orders Placed This Encounter  Procedures  . Ambulatory referral to Pulmonology  . EKG  12-Lead   No orders of the defined types were placed in this encounter.   Patient Instructions  Medication Instructions:  Your physician recommends that you continue on your current medications as directed. Please refer to the Current Medication list given to you today.  *If you need a refill on your cardiac medications before your next appointment, please call your pharmacy*  Follow-Up:  You have been referred to Pulmonology for evaluation. You may call [phone] by the end of the week if you do not receive a call from someone.   At [facility], you and your health needs are our priority.  As part of our continuing mission to provide you with exceptional heart care, we have created designated Provider Care Teams.  These Care Teams include your primary Cardiologist (physician) and Advanced Practice Providers (APPs -  Physician Assistants and Nurse Practitioners) who all work together to provide you with the care you need, when you need it.  We recommend signing up for the patient portal called "MyChart".  Sign up information is provided on this After Visit Summary.  MyChart is used to connect with patients for  Virtual Visits (Telemedicine).  Patients are able to view lab/test results, encounter notes, upcoming appointments, etc.  Non-urgent messages can be sent to your provider as well.   To learn more about what you can do with MyChart, go to [email or web address].    Your next appointment:   6-12 month(s)  The format for your next appointment:   In Person  Provider:   You may see Kate Sable, MD or one of the following Advanced Practice Providers on your designated Care Team:    Murray Hodgkins, NP  Christell Faith, PA-C  Marrianne Mood, PA-C  Cadence [place], [place]  Laurann Montana, NP     Signed, Kate Sable, MD  01/13/2020 12:30 PM    [place]

## 2020-01-13 NOTE — Patient Instructions (Signed)
Medication Instructions:  Your physician recommends that you continue on your current medications as directed. Please refer to the Current Medication list given to you today.  *If you need a refill on your cardiac medications before your next appointment, please call your pharmacy*  Follow-Up:  You have been referred to Pulmonology for evaluation. You may call [phone] by the end of the week if you do not receive a call from someone.   At [facility], you and your health needs are our priority.  As part of our continuing mission to provide you with exceptional heart care, we have created designated Provider Care Teams.  These Care Teams include your primary Cardiologist (physician) and Advanced Practice Providers (APPs -  Physician Assistants and Nurse Practitioners) who all work together to provide you with the care you need, when you need it.  We recommend signing up for the patient portal called "MyChart".  Sign up information is provided on this After Visit Summary.  MyChart is used to connect with patients for Virtual Visits (Telemedicine).  Patients are able to view lab/test results, encounter notes, upcoming appointments, etc.  Non-urgent messages can be sent to your provider as well.   To learn more about what you can do with MyChart, go to [email or web address].    Your next appointment:   6-12 month(s)  The format for your next appointment:   In Person  Provider:   You may see Kate Sable, MD or one of the following Advanced Practice Providers on your designated Care Team:    Murray Hodgkins, NP  Christell Faith, PA-C  Marrianne Mood, PA-C  Cadence [place], [place]  Laurann Montana, NP

## 2020-02-24 ENCOUNTER — Institutional Professional Consult (permissible substitution): Payer: Managed Care, Other (non HMO) | Admitting: Pulmonary Disease

## 2020-03-27 ENCOUNTER — Other Ambulatory Visit: Payer: Self-pay

## 2020-03-27 DIAGNOSIS — F419 Anxiety disorder, unspecified: Secondary | ICD-10-CM

## 2020-03-27 DIAGNOSIS — F32A Depression, unspecified: Secondary | ICD-10-CM

## 2020-03-27 DIAGNOSIS — R7303 Prediabetes: Secondary | ICD-10-CM

## 2020-03-27 NOTE — Telephone Encounter (Signed)
Patient requests refill on: Sertraline 50 mg   LAST REFILL: 11/04/2019 (Q-30, R-3) LAST OV: 12/30/2019 NEXT OV: Not Scheduled  PHARMACY: [facility] [place], [place]   Patient is also requesting order for glucometer

## 2020-03-29 MED ORDER — SERTRALINE HCL 50 MG PO TABS: 50.0000 mg | ORAL_TABLET | Freq: Every day | ORAL | 3 refills | Status: DC

## 2020-03-29 NOTE — Telephone Encounter (Signed)
Sertraline Rx sent.  Reasonable for transfer of care appointment late spring or summertime of this year.  Please have him check to see which diabetic meter is covered, update [place], and we can send it.  Thanks.

## 2020-03-30 ENCOUNTER — Encounter: Payer: Managed Care, Other (non HMO) | Admitting: Internal Medicine

## 2020-03-31 NOTE — Telephone Encounter (Signed)
[facility] and patients insurance will cover accu-check guide meter and strips

## 2020-04-01 MED ORDER — BLOOD GLUCOSE MONITOR KIT: PACK | 0 refills | Status: DC

## 2020-04-01 NOTE — Telephone Encounter (Signed)
Sent. Thanks.   

## 2020-04-01 NOTE — Addendum Note (Signed)
Addended by: Tonia Ghent on: 04/01/2020 03:33 PM   Modules accepted: Orders

## 2020-04-03 ENCOUNTER — Other Ambulatory Visit: Payer: Self-pay

## 2020-04-03 MED ORDER — ACCU-CHEK GUIDE W/DEVICE KIT: PACK | 0 refills | Status: DC

## 2020-04-03 MED ORDER — ACCU-CHEK GUIDE VI STRP: ORAL_STRIP | 12 refills | Status: DC

## 2020-04-03 MED ORDER — ACCU-CHEK SOFTCLIX LANCETS MISC: 12 refills | Status: DC

## 2020-04-03 NOTE — Progress Notes (Signed)
[facility] glucose meter and supplies.

## 2020-04-06 ENCOUNTER — Telehealth: Payer: Self-pay

## 2020-04-06 NOTE — Telephone Encounter (Signed)
[place] Night - Client Nonclinical Telephone Record AccessNurse Client [place] Night - Client Client Site [place] Physician Waunita Schooner- MD Contact Type Call Who Is Calling Patient / Member / Family / Caregiver Caller Name [place] Phone Number [phone] Patient Name Mitchell Lewis Patient DOB 1960/05/17 Call Type Message Only Information Provided Reason for Call Medication Question / Request Initial Comment Caller states, husbands prescriptions were not called. Additional Comment Caller states, medication is 90 day supply Sertraline 50mg . Disp. Time Disposition Final User 04/06/2020 7:42:21 AM General Information Provided Yes Jobie Quaker Call Closed By: Jobie Quaker Transaction Date/Time: 04/06/2020 7:37:26 AM (ET)

## 2020-04-06 NOTE — Telephone Encounter (Signed)
I spoke with pts wife;pts wife (DPR signed) said that she thought before D Carlean Purl FNP was leaving that D Carlean Purl FNP was going to send in a  90 day Sertraline rx  to [facility]. See 03/27/20 refill note; then pts wife thought 90 day was going to be sent in on the request for sertraline from 03/27/20; and when Mrs Pafford went to pick up med on 04/03/20 was advised by someone at [facility] that there was no refill for Sertraline at [facility]. I spoke with Branna at [facility] and she said Sertraline was ready for pick up. Jacelyn Grip was not aware of [facility] with [facility]. I advised Mrs Popelka that Sertraline was ready for pick up at [facility] could not advise why it was not given to her on Friday.I apologized but advised she could pick up at any time. Mrs Smithson voiced understanding but said if Sertraline was not going to be called in as she asked with 90 day supply she would have thought she would have received a cb from [facility]. I again apologized and said that some providers might do a 90 day supply for sertraline and then others would prefer 30 day. Since sertraline # 30 was already ready for pick up about 1 wk prior to pt being out of med please call [facility] and depending on if pt had scheduled TOC appt with Dr [name] would determine if refill request would go to Dr [name] or back to Dr Damita Dunnings for the 90 day refill.pt travels and pts wife will cb to schedule TOC when she is aware pt will be in town. pts wife voiced understanding and will cb as requested and Mrs Spychalski will pick up Sertraline that is at [facility]. Sending FYI to DR Damita Dunnings and Dr [name].

## 2020-04-06 NOTE — Telephone Encounter (Signed)
Noted  

## 2020-04-12 NOTE — Telephone Encounter (Addendum)
Noted.  I had previously sent this prescription for 30 pills at a time with several refills because it appeared that was how the patient had gotten it before.  Thank you for taking care of this.

## 2020-04-27 HISTORY — PX: COLONOSCOPY: SHX174

## 2020-04-27 LAB — HM COLONOSCOPY

## 2020-05-13 ENCOUNTER — Other Ambulatory Visit: Payer: Self-pay | Admitting: Family Medicine

## 2020-07-03 ENCOUNTER — Other Ambulatory Visit: Payer: Self-pay | Admitting: Family Medicine

## 2020-07-03 DIAGNOSIS — F32A Depression, unspecified: Secondary | ICD-10-CM

## 2020-07-13 ENCOUNTER — Ambulatory Visit: Payer: Managed Care, Other (non HMO) | Admitting: Cardiology

## 2020-08-28 ENCOUNTER — Encounter: Payer: Self-pay | Admitting: Gastroenterology

## 2020-08-31 ENCOUNTER — Ambulatory Visit: Payer: Managed Care, Other (non HMO) | Admitting: Cardiology

## 2020-10-19 ENCOUNTER — Other Ambulatory Visit: Payer: Self-pay

## 2020-10-19 ENCOUNTER — Ambulatory Visit: Payer: Managed Care, Other (non HMO) | Admitting: Sports Medicine

## 2020-10-19 ENCOUNTER — Ambulatory Visit (INDEPENDENT_AMBULATORY_CARE_PROVIDER_SITE_OTHER): Payer: Managed Care, Other (non HMO)

## 2020-10-19 VITALS — BP 158/82 | HR 88 | Ht 68.0 in | Wt 305.0 lb

## 2020-10-19 DIAGNOSIS — M5441 Lumbago with sciatica, right side: Secondary | ICD-10-CM | POA: Diagnosis not present

## 2020-10-19 DIAGNOSIS — R29898 Other symptoms and signs involving the musculoskeletal system: Secondary | ICD-10-CM

## 2020-10-19 DIAGNOSIS — M5442 Lumbago with sciatica, left side: Secondary | ICD-10-CM

## 2020-10-19 DIAGNOSIS — G8929 Other chronic pain: Secondary | ICD-10-CM

## 2020-10-19 MED ORDER — CYCLOBENZAPRINE HCL 10 MG PO TABS: 10.0000 mg | ORAL_TABLET | Freq: Every day | ORAL | 0 refills | Status: DC

## 2020-10-19 MED ORDER — ALPRAZOLAM 0.5 MG PO TABS: 0.5000 mg | ORAL_TABLET | Freq: Every evening | ORAL | 0 refills | Status: DC | PRN

## 2020-10-19 MED ORDER — MELOXICAM 15 MG PO TABS: 15.0000 mg | ORAL_TABLET | Freq: Every day | ORAL | 0 refills | Status: DC

## 2020-10-19 NOTE — Progress Notes (Signed)
Mitchell Lewis D.[place] Phone: [phone]   Assessment and Plan:     1. Right leg weakness  2. Acute bilateral low back pain with right-sided sciatica  3. Chronic midline low back pain with left-sided sciatica - DG Lumbar Spine 2-3 Views; Future - MR LUMBAR SPINE WO CONTRAST; Future mouth at bedtime.  - Acute with uncertain prognosis, no improvement, initial sports medicine visit - Likely disc herniation and lumbar spine based on HPI, physical exam - X-ray obtained in clinic.  My interpretation: No acute fracture, disc space overall maintained - Because patient is having right leg weakness, I feel it is necessary to obtain an urgent lumbar MRI for further evaluation - Patient has claustrophobia with MRI machines.  May take Xanax 0.5 mg with additional 0.5 mg as needed for MRI - Start Flexeril 10 mg nightly for pain relief - Start meloxicam 15 mg daily for pain relief - May use Tylenol as needed for pain control - Work note provided for patient to be out for the next week until reevaluation    Other orders - meloxicam (MOBIC) 15 MG tablet; Take 1 tablet (15 mg total) by mouth daily. - ALPRAZolam (XANAX) 0.5 MG tablet; Take 1 tablet (0.5 mg total) by mouth at bedtime as needed for anxiety. - cyclobenzaprine (FLEXERIL) 10 MG tablet; Take 1 tablet (10 mg total) by   Pertinent previous records reviewed include none   Follow Up: after MRI to discuss results     Subjective:   I, Mitchell Lewis, am serving as a Education administrator for Dr. Oneida Arenas.  Chief Complaint: Right back and buttock pain   HPI:   10/19/20 Right back and butt pain. Constant can't get comfortable, cant sleep, sharp pain started 3-4 weeks ago, but severe over the last 3-4 days. Shoots down to toes but settles in calf.  Endorses numbness down his leg and into his quad primarily.  Denies saddle paresthesia or urinary incontinence.  Been  taking ibuprofen not helping.  He is felt that his right leg would give out from under him on several occasions.  He has a history of herniated disc that presented similarly several years ago.   Relevant Historical Information: Self-reported history of herniated disc with similar presenting symptoms  Additional pertinent review of systems negative.   Current Outpatient Medications:    ALPRAZolam (XANAX) 0.5 MG tablet, Take 1 tablet (0.5 mg total) by mouth at bedtime as needed for anxiety., Disp: 2 tablet, Rfl: 0   cyclobenzaprine (FLEXERIL) 10 MG tablet, Take 1 tablet (10 mg total) by mouth at bedtime., Disp: 10 tablet, Rfl: 0   meloxicam (MOBIC) 15 MG tablet, Take 1 tablet (15 mg total) by mouth daily., Disp: 30 tablet, Rfl: 0   Accu-Chek Softclix Lancets lancets, Use as instructed daily to check sugars. DX R73.03, Disp: 100 each, Rfl: 12   atorvastatin (LIPITOR) 20 MG tablet, Take 1 tablet (20 mg total) by mouth daily., Disp: 90 tablet, Rfl: 3   Blood Glucose Monitoring Suppl (ACCU-CHEK GUIDE ME) w/Device KIT, USE DAILY AS NEEDED TO CHECK SUGAR, Disp: 1 kit, Rfl: 0   Cholecalciferol (DIALYVITE VITAMIN D 5000) 125 MCG (5000 UT) capsule, Take 5,000 Units by mouth daily., Disp: , Rfl:    diphenhydrAMINE (BENADRYL) 25 MG tablet, Take 25 mg by mouth at bedtime as needed for sleep., Disp: , Rfl:    glucose blood (ACCU-CHEK GUIDE) test strip, Use as needed daily to  check sugars. Dx R73.03, Disp: 100 each, Rfl: 12   ibuprofen (ADVIL) 200 MG tablet, Take 200-400 mg by mouth every 6 (six) hours as needed for headache or moderate pain., Disp: , Rfl:    Menthol, Topical Analgesic, (BIOFREEZE EX), Apply 1 patch topically daily as needed (pain). , Disp: , Rfl:    Pediatric Multivitamins-Iron (FLINTSTONES COMPLETE PO), Take 1 tablet by mouth daily., Disp: , Rfl:    sertraline (ZOLOFT) 50 MG tablet, TAKE 1 TABLET BY MOUTH EVERY DAY, Disp: 90 tablet, Rfl: 1   zinc gluconate 50 MG tablet, Take 50 mg by mouth  daily., Disp: , Rfl:    Objective:     Vitals:   10/19/20 1431  BP: (!) 158/82  Pulse: 88  SpO2: 93%  Weight: (!) 305 lb (138.3 kg)  Height: '5\' 8"'  (1.727 m)      Body mass index is 46.38 kg/m.    Physical Exam:    Gen: Appears well, appears to be in moderate to severe amount of pain from lower back Psych: Alert and oriented, appropriate mood and affect Skin: no susupicious lesions or rashes  Back - Normal skin, Spine with normal alignment and no deformity.   No tenderness to vertebral process palpation.   Paraspinous muscles are tender bilaterally and with spasm Straight leg raise positive on right, negative on left Neuro: sensation intact, strength is 4/5 in right lower extremity and 5/5 in left lower extremity, muscle tone wnl   Electronically signed by:  Mitchell Lewis D.[facility] 3:51 PM 10/19/20

## 2020-10-19 NOTE — Patient Instructions (Addendum)
Xray today  Meloxicam 15 mg everyday with food for 2-3 weeks remaining pills as needed Flexeril as needed Tylenol for breakthrough pain Xanax 0.5 mg 1 pill first if that's not enough then take 2nd  When we receive your results we will contact you.   Herniated Disk Rehab Ask your health care provider which exercises are safe for you. Do exercises exactly as told by your health care provider and adjust them as directed. It is normal to feel mild stretching, pulling, tightness, or discomfort as you do these exercises. Stop right away if you feel sudden pain or your pain gets worse. Do not begin these exercises until told by your health care provider. Stretching and range-of-motion exercises These exercises warm up your muscles and joints and improve the movement and flexibility of your back. These exercises may also help to relieve pain, numbness, and tingling. Prone extension on elbows  Lie on your abdomen on a firm surface (prone position). Prop yourself up on your elbows. Use your arms to help lift your chest up until you feel a gentle stretch in your abdomen and your lower back. This will place some of your body weight on your elbows. If this is uncomfortable, try stacking pillows under your chest. Your hips should stay down, against the surface that you are lying on. Keep your hips, back, and buttocks muscles relaxed. Hold this position for __________ seconds. Slowly relax your upper body and return to the starting position. Repeat __________ times. Complete this exercise __________ times a day. Standing extension Stand with your feet shoulder-width apart. Place your hands on your lower back, with your palms on your back. Gently arch your back (extension) and press forward with your hands. Keep your hips over your toes. Do not let your hips drift forward while arching your back. Hold this position for __________ seconds. Slowly return to the starting position. Repeat __________ times.  Complete this exercise __________ times a day. Strengthening exercises These exercises build muscle strength and endurance in your back. Endurance is the ability to use your muscles for a long time, even after they get tired. Pelvic tilt Lie on your back on a firm surface. Bend your knees and keep your feet flat on the floor. Tense your abdominal muscles. Tip your pelvis up toward the ceiling and flatten your lower back into the floor. To help with this exercise, you may place a small towel under your lower back and try to push your back into the towel. Hold this position for __________ seconds. Let your muscles relax completely before you repeat this exercise. Repeat __________ times. Complete this exercise __________ times a day. Alternating arm and leg raises  Get on your hands and knees on a firm surface. If you are on a hard floor, you may want to use padding, such as an exercise mat, to cushion your knees. Line up your arms and legs. Your hands should be below your shoulders, and your knees should be below your hips. Lift your left leg behind you. At the same time, raise your right arm and straighten it in front of you. If you have trouble with your balance or the exercise is painful, start by raising one arm or one leg individually first. Do not lift your leg higher than your hip. Do not lift your arm higher than your shoulder. Keep your abdominal and back muscles tight. Keep your hips facing the ground. Do not arch your back. Keep your balance carefully. Continue to look down at  the floor for improved balance. Breathe easy and do not hold your breath. Hold this position for __________ seconds. Slowly return to the starting position and repeat with your right leg and your left arm. Repeat __________ times. Complete this exercise __________ times a day. Bridge  Lie on your back on a firm surface with your knees bent and your feet flat on the floor. Tighten your abdominal and  buttocks muscles and lift your buttocks off the floor until your trunk and hips are level with your thighs. You should feel the muscles working in your buttocks and the back of your thighs. Do not arch your back. If this exercise is too easy, try doing it with your arms crossed over your chest. Hold this position for __________ seconds. Slowly lower your hips to the starting position. Let your muscles relax completely after each repetition. Repeat __________ times. Complete this exercise __________ times a day. This information is not intended to replace advice given to you by your health care provider. Make sure you discuss any questions you have with your health care provider. Document Revised: 11/04/2019 Document Reviewed: 11/04/2019 Elsevier Patient Education  2022 [employer].

## 2020-10-23 ENCOUNTER — Other Ambulatory Visit: Payer: Self-pay | Admitting: Sports Medicine

## 2020-10-23 DIAGNOSIS — F419 Anxiety disorder, unspecified: Secondary | ICD-10-CM

## 2020-10-23 MED ORDER — CYCLOBENZAPRINE HCL 10 MG PO TABS
10.0000 mg | ORAL_TABLET | Freq: Every day | ORAL | 0 refills | Status: DC
Start: 2020-10-23 — End: 2020-11-03

## 2020-10-23 MED ORDER — ALPRAZOLAM 0.5 MG PO TABS: 0.2500 mg | ORAL_TABLET | ORAL | 0 refills | Status: DC | PRN

## 2020-10-23 NOTE — Progress Notes (Signed)
Rx sent 

## 2020-10-23 NOTE — Addendum Note (Signed)
Addended by: Vilma Meckel R on: 10/23/2020 02:26 PM   Modules accepted: Orders

## 2020-10-29 ENCOUNTER — Telehealth: Payer: Self-pay | Admitting: Sports Medicine

## 2020-10-29 NOTE — Telephone Encounter (Signed)
MRI was denied due to patient needing 6 weeks of therapy and treatment. MRI was scheduled with [facility] on 10/30/2020. Appointment has been canceled. Will contact patient to schedule for a follow up with Dr Glennon Mac in 6 weeks.

## 2020-10-29 NOTE — Telephone Encounter (Signed)
Peer to peer set up for 10/30/2020 at 845 to try and get the MRI approved.

## 2020-10-29 NOTE — Telephone Encounter (Signed)
Spoke to patient's wife. She said that there is no way that the patient can wait 6 weeks to do something. He is in a tremendous amount of pain and not sleeping. She told [facility] not to cancel the appointment. I explained to her that the insurance will not cover it unless there is 6 weeks between.   Any advice on where to go from here?

## 2020-10-29 NOTE — Telephone Encounter (Signed)
Peer to peer completed. Auth# [id number] Patient and [facility] and patient informed.

## 2020-11-03 ENCOUNTER — Ambulatory Visit: Payer: Managed Care, Other (non HMO) | Admitting: Sports Medicine

## 2020-11-03 ENCOUNTER — Other Ambulatory Visit: Payer: Self-pay

## 2020-11-03 VITALS — BP 138/82 | HR 101 | Ht 68.0 in | Wt 305.0 lb

## 2020-11-03 DIAGNOSIS — M5136 Other intervertebral disc degeneration, lumbar region: Secondary | ICD-10-CM | POA: Diagnosis not present

## 2020-11-03 DIAGNOSIS — M4807 Spinal stenosis, lumbosacral region: Secondary | ICD-10-CM

## 2020-11-03 DIAGNOSIS — M51369 Other intervertebral disc degeneration, lumbar region without mention of lumbar back pain or lower extremity pain: Secondary | ICD-10-CM

## 2020-11-03 DIAGNOSIS — M47816 Spondylosis without myelopathy or radiculopathy, lumbar region: Secondary | ICD-10-CM

## 2020-11-03 MED ORDER — MELOXICAM 15 MG PO TABS
15.0000 mg | ORAL_TABLET | Freq: Every day | ORAL | 0 refills | Status: DC
Start: 2020-11-03 — End: 2020-12-31

## 2020-11-03 MED ORDER — CYCLOBENZAPRINE HCL 10 MG PO TABS
10.0000 mg | ORAL_TABLET | Freq: Every day | ORAL | 0 refills | Status: DC
Start: 2020-11-03 — End: 2021-02-15

## 2020-11-03 NOTE — Progress Notes (Signed)
Benito Mccreedy D.[place] Phone: [phone]   Assessment and Plan:     1. DDD (degenerative disc disease), lumbar 2. Facet arthritis of lumbar region 3. Foraminal stenosis of lumbosacral region -Acute, mildly improved, uncertain prognosis, subsequent visit - Patient's symptoms consistent with an L5 radiculopathy - With multiple MRI findings including moderate to severe facet arthrosis lumbar spine, grade 1 anterior listhesis, potential impingement of traversing L5 nerve roots, moderate bilateral foraminal stenosis with patient stating that leaning forward on an object to alleviate low back symptoms temporarily, will refer to spinal surgery for further evaluation and treatment - Reviewed outside MRI report with impression "multilevel DDD and facet arthrosis, more prominent lower lumbar spine.  At L4-5 there is grade 1 degenerative spinal listhesis and prominent posterior hypertrophic changes resulting in moderate spinal canal and left greater than right lateral recess stenosis.  Potential impingement of the traversing L5 nerve roots within the lateral recesses, particularly on the left.  There is also moderate right greater than left foraminal stenosis at this level.  Mild bilateral foraminal stenosis L5-S1" - Continue Flexeril 5 mg as needed for muscle spasms - Continue meloxicam 15 mg daily x2 weeks for acute pain - Continue HEP for low back -Referral sent to spinal surgery   Pertinent previous records reviewed include outside MRI, previous office note   Follow Up: As needed if no improvement or worsening symptoms   Subjective:   I, Mitchell Lewis, am serving as a scribe for Dr. Glennon Mac  Chief Complaint: Low back pain follow up   HPI:   10/19/20 Right back and butt pain. Constant can't get comfortable, cant sleep, sharp pain started 3-4 weeks ago, but severe over the last 3-4 days. Shoots down to  toes but settles in calf.  Endorses numbness down his leg and into his quad primarily.  Denies saddle paresthesia or urinary incontinence.  Been taking ibuprofen not helping.  He is felt that his right leg would give out from under him on several occasions.  He has a history of herniated disc that presented similarly several years ago.  11/03/20 Patient states that he has gotten a little bit better. Patient states that the pain has started radiating to his R foot with a numbness feeling and it is hard to sleep at night. When patient lays down he has a burning in his buttock on both sides.   Relevant Historical Information: OSA, chronic back pain, prediabetes, obesity  Additional pertinent review of systems negative.   Current Outpatient Medications:    Accu-Chek Softclix Lancets lancets, Use as instructed daily to check sugars. DX R73.03, Disp: 100 each, Rfl: 12   ALPRAZolam (XANAX) 0.5 MG tablet, Take 0.5 tablets (0.25 mg total) by mouth as needed (take one tablet prior to MRI. May take second tablet as needed)., Disp: 2 tablet, Rfl: 0   atorvastatin (LIPITOR) 20 MG tablet, Take 1 tablet (20 mg total) by mouth daily., Disp: 90 tablet, Rfl: 3   Blood Glucose Monitoring Suppl (ACCU-CHEK GUIDE ME) w/Device KIT, USE DAILY AS NEEDED TO CHECK SUGAR, Disp: 1 kit, Rfl: 0   Cholecalciferol (DIALYVITE VITAMIN D 5000) 125 MCG (5000 UT) capsule, Take 5,000 Units by mouth daily., Disp: , Rfl:    diphenhydrAMINE (BENADRYL) 25 MG tablet, Take 25 mg by mouth at bedtime as needed for sleep., Disp: , Rfl:    glucose blood (ACCU-CHEK GUIDE) test strip, Use as needed daily to check  sugars. Dx R73.03, Disp: 100 each, Rfl: 12   ibuprofen (ADVIL) 200 MG tablet, Take 200-400 mg by mouth every 6 (six) hours as needed for headache or moderate pain., Disp: , Rfl:    Menthol, Topical Analgesic, (BIOFREEZE EX), Apply 1 patch topically daily as needed (pain). , Disp: , Rfl:    Pediatric Multivitamins-Iron (FLINTSTONES  COMPLETE PO), Take 1 tablet by mouth daily., Disp: , Rfl:    sertraline (ZOLOFT) 50 MG tablet, TAKE 1 TABLET BY MOUTH EVERY DAY, Disp: 90 tablet, Rfl: 1   zinc gluconate 50 MG tablet, Take 50 mg by mouth daily., Disp: , Rfl:    cyclobenzaprine (FLEXERIL) 10 MG tablet, Take 1 tablet (10 mg total) by mouth at bedtime., Disp: 10 tablet, Rfl: 0   meloxicam (MOBIC) 15 MG tablet, Take 1 tablet (15 mg total) by mouth daily., Disp: 30 tablet, Rfl: 0   Objective:     Vitals:   11/03/20 0931  BP: 138/82  Pulse: (!) 101  SpO2: 95%  Weight: (!) 305 lb (138.3 kg)  Height: 5' 8" (1.727 m)      Body mass index is 46.38 kg/m.    Physical Exam:    Gen: Appears well, nad, nontoxic and pleasant Psych: Alert and oriented, appropriate mood and affect Neuro: sensation decreased along lateral side of right leg wrapping around to top of foot.  Strength is 5/5 in upper and lower extremities, muscle tone wnl Skin: no susupicious lesions or rashes  Back - Normal skin, Spine with normal alignment and no deformity.   No tenderness to vertebral process palpation.   Paraspinous muscles are tender bilaterally and without spasm Straight leg raise negative Trendelenberg negative    Electronically signed by:  Benito Mccreedy D.[facility] 10:02 AM 11/03/20

## 2020-11-03 NOTE — Patient Instructions (Addendum)
Good to see you Referred to spinal surgery  Muscle relaxer and meloxicam refilled Contact [facility] and get a hard copy of the MRI to bring to the surgeon  See me again as needed

## 2020-11-05 ENCOUNTER — Encounter: Payer: Self-pay | Admitting: Nurse Practitioner

## 2020-11-05 ENCOUNTER — Ambulatory Visit: Payer: Managed Care, Other (non HMO) | Admitting: Nurse Practitioner

## 2020-11-05 ENCOUNTER — Other Ambulatory Visit: Payer: Self-pay

## 2020-11-05 VITALS — BP 150/82 | HR 87 | Temp 97.9°F | Resp 20 | Ht 67.75 in | Wt 309.1 lb

## 2020-11-05 DIAGNOSIS — G4733 Obstructive sleep apnea (adult) (pediatric): Secondary | ICD-10-CM

## 2020-11-05 DIAGNOSIS — Z6841 Body Mass Index (BMI) 40.0 and over, adult: Secondary | ICD-10-CM

## 2020-11-05 DIAGNOSIS — F419 Anxiety disorder, unspecified: Secondary | ICD-10-CM

## 2020-11-05 DIAGNOSIS — R7303 Prediabetes: Secondary | ICD-10-CM

## 2020-11-05 DIAGNOSIS — R351 Nocturia: Secondary | ICD-10-CM

## 2020-11-05 DIAGNOSIS — R5383 Other fatigue: Secondary | ICD-10-CM

## 2020-11-05 DIAGNOSIS — Z125 Encounter for screening for malignant neoplasm of prostate: Secondary | ICD-10-CM

## 2020-11-05 DIAGNOSIS — E785 Hyperlipidemia, unspecified: Secondary | ICD-10-CM | POA: Diagnosis not present

## 2020-11-05 DIAGNOSIS — N401 Enlarged prostate with lower urinary tract symptoms: Secondary | ICD-10-CM

## 2020-11-05 DIAGNOSIS — F32A Depression, unspecified: Secondary | ICD-10-CM

## 2020-11-05 MED ORDER — DOXAZOSIN MESYLATE 1 MG PO TABS: 1.0000 mg | ORAL_TABLET | Freq: Every day | ORAL | 1 refills | Status: DC

## 2020-11-05 MED ORDER — SERTRALINE HCL 100 MG PO TABS: 100.0000 mg | ORAL_TABLET | Freq: Every day | ORAL | 3 refills | Status: DC

## 2020-11-05 MED ORDER — ATORVASTATIN CALCIUM 20 MG PO TABS: 20.0000 mg | ORAL_TABLET | Freq: Every day | ORAL | 3 refills | Status: DC

## 2020-11-05 NOTE — Assessment & Plan Note (Signed)
Cervical diagnosis the patient does have nocturia 2-5 times nightly.  We will can place patient on tamsulosin but has an allergy to sulfa.  We will look for different alpha 1 to place patient on give a trial.

## 2020-11-05 NOTE — Progress Notes (Signed)
Established Patient Office Visit  Subjective:  Patient ID: Mitchell Lewis, male    DOB: 16-Dec-1960  Age: 60 y.o. MRN: [id number]  CC:  Chief Complaint  Patient presents with   Transfer of care   Anxiety    Would like to change Sertraline to 100 mg daily, has been taking 50 mg 2 tablets daily and symptoms are better.    HPI Mitchell Lewis presents for TOC  OSA: No CPAP. States he did an at home test then was told that he would need to come in for an inperson sleep study. States that is schedule is changing and that the insurance was not wanting to pay and it would cost upwards of $4,000  Low back pain: Dr. Benito Mccreedy. MRi of lumbar spine recently. Not resulted in chart yet. States he was referred to Ortho group. Awaiting appointment. Has numbness/tingling on the right leg.  BPH: Does have nocutria. States was on flomax in the past without any real relief. DO feel this is related to his feeling tired in the morning because he has to get up so many times during the night.  Prediabetes: Eats three times daily. Unable to do much exercise dur to his current back condition. Travels. When at work no snacking. Not many sugar drinks.  Anxiety and depression: States that he has been on 176m sertarline for about 6 months. Doing well on it.  Cardiology: Dr. BClaudius Sis  Dermatology specialist: [facility]ly at least [facility]   Past Medical History:  Diagnosis Date   Colon polyp    Fatty liver    History of colon polyps 2018   Sleep apnea     Past Surgical History:  Procedure Laterality Date   COLONOSCOPY  01/09/2017   RIGHT/LEFT HEART CATH AND CORONARY ANGIOGRAPHY Bilateral 12/24/2019   Procedure: RIGHT/LEFT HEART CATH AND CORONARY ANGIOGRAPHY;  Surgeon: ENelva Bush MD;  [facility];  Service: Cardiovascular;  Laterality: Bilateral;   TMJ ARTHROSCOPY  1993   VASECTOMY     WISDOM TOOTH EXTRACTION  03/17/2017    Family  History  Problem Relation Age of Onset   Diabetes Mother    Cancer Father    Heart disease Father    Kidney failure Father    Prostate cancer Paternal Grandfather     Social History   Socioeconomic History   Marital status: Married    Spouse name: Not on file   Number of children: 2   Years of education: Not on file   Highest education level: Not on file  Occupational History   Occupation: full time Super  Tobacco Use   Smoking status: Former    Packs/day: 1.00    Years: 40.00    Pack years: 40.00    Types: Cigarettes    Quit date: 05/15/2013    Years since quitting: 7.4   Smokeless tobacco: Never  Vaping Use   Vaping Use: Never used  Substance and Sexual Activity   Alcohol use: Yes    Alcohol/week: 1.0 standard drink    Types: 1 Standard drinks or equivalent per week   Drug use: No   Sexual activity: Not on file  Other Topics Concern   Not on file  Social History Narrative   Not on file   Social Determinants of Health   Financial Resource Strain: Not on file  Food Insecurity: Not on file  Transportation Needs: Not on file  Physical Activity: Not on file  Stress: Not on file  Social Connections: Not on file  Intimate Partner Violence: Not on file    Outpatient Medications Prior to Visit  Medication Sig Dispense Refill   Accu-Chek Softclix Lancets lancets Use as instructed daily to check sugars. DX R73.03 100 each 12   atorvastatin (LIPITOR) 20 MG tablet Take 1 tablet (20 mg total) by mouth daily. 90 tablet 3   Blood Glucose Monitoring Suppl (ACCU-CHEK GUIDE ME) w/Device KIT USE DAILY AS NEEDED TO CHECK SUGAR 1 kit 0   Cholecalciferol (DIALYVITE VITAMIN D 5000) 125 MCG (5000 UT) capsule Take 5,000 Units by mouth daily.     cyclobenzaprine (FLEXERIL) 10 MG tablet Take 1 tablet (10 mg total) by mouth at bedtime. 10 tablet 0   diphenhydrAMINE (BENADRYL) 25 MG tablet Take 25 mg by mouth at bedtime as needed for sleep.     glucose blood (ACCU-CHEK GUIDE) test  strip Use as needed daily to check sugars. Dx R73.03 100 each 12   ibuprofen (ADVIL) 200 MG tablet Take 200-400 mg by mouth every 6 (six) hours as needed for headache or moderate pain.     meloxicam (MOBIC) 15 MG tablet Take 1 tablet (15 mg total) by mouth daily. 30 tablet 0   Menthol, Topical Analgesic, (BIOFREEZE EX) Apply 1 patch topically daily as needed (pain).      Pediatric Multivitamins-Iron (FLINTSTONES COMPLETE PO) Take 1 tablet by mouth daily.     sertraline (ZOLOFT) 50 MG tablet TAKE 1 TABLET BY MOUTH EVERY DAY 90 tablet 1   zinc gluconate 50 MG tablet Take 50 mg by mouth daily.     ALPRAZolam (XANAX) 0.5 MG tablet Take 0.5 tablets (0.25 mg total) by mouth as needed (take one tablet prior to MRI. May take second tablet as needed). 2 tablet 0   No facility-administered medications prior to visit.    Allergies  Allergen Reactions   Other Shortness Of Breath and Itching    Chicken feathers   Sulfa Antibiotics Rash and Hives    ROS Review of Systems  Constitutional:  Positive for fatigue. Negative for chills and fever.  Respiratory:  Negative for cough and shortness of breath.   Cardiovascular:  Negative for chest pain.  Gastrointestinal:  Negative for blood in stool, diarrhea, nausea and vomiting.  Genitourinary:  Negative for dysuria and frequency.       Nocturia 2-5.   Neurological:  Positive for weakness and numbness. Negative for dizziness, light-headedness and headaches.  Psychiatric/Behavioral:  Negative for hallucinations and suicidal ideas.      Objective:    Physical Exam Vitals and nursing note reviewed.  Constitutional:      Appearance: He is obese.  HENT:     Right Ear: Tympanic membrane, ear canal and external ear normal. There is no impacted cerumen.     Left Ear: Tympanic membrane, ear canal and external ear normal. There is no impacted cerumen.     Mouth/Throat:     Mouth: Mucous membranes are moist.     Pharynx: Oropharynx is clear.     Comments:  Dentures that he was not wearing Eyes:     Extraocular Movements: Extraocular movements intact.     Pupils: Pupils are equal, round, and reactive to light.  Neck:     Thyroid: No thyroid mass, thyromegaly or thyroid tenderness.  Cardiovascular:     Rate and Rhythm: Normal rate and regular rhythm.  Pulmonary:     Effort: Pulmonary effort is normal.  Breath sounds: Normal breath sounds.  Abdominal:     General: Bowel sounds are normal.  Musculoskeletal:     Right lower leg: No edema.     Left lower leg: No edema.  Lymphadenopathy:     Cervical: No cervical adenopathy.  Skin:    General: Skin is warm.  Neurological:     Mental Status: He is alert. Mental status is at baseline.  Psychiatric:        Mood and Affect: Mood normal.        Behavior: Behavior normal.        Thought Content: Thought content normal.        Judgment: Judgment normal.    BP (!) 168/94   Pulse 87   Temp 97.9 F (36.6 C)   Resp 20   Ht 5' 7.75" (1.721 m)   Wt (!) 309 lb 2 oz (140.2 kg)   SpO2 94%   BMI 47.35 kg/m  Wt Readings from Last 3 Encounters:  11/05/20 (!) 309 lb 2 oz (140.2 kg)  11/03/20 (!) 305 lb (138.3 kg)  10/19/20 (!) 305 lb (138.3 kg)   PHQ9 SCORE ONLY 12/30/2019 11/04/2019 03/01/2017  PHQ-9 Total Score '6 13 7     ' Health Maintenance Due  Topic Date Due   HIV Screening  Never done   Hepatitis C Screening  Never done   Zoster Vaccines- Shingrix (1 of 2) Never done    There are no preventive care reminders to display for this patient.  Lab Results  Component Value Date   TSH 1.90 11/04/2019   Lab Results  Component Value Date   WBC 9.3 12/16/2019   HGB 17.4 12/16/2019   HCT 51.8 (H) 12/16/2019   MCV 97 12/16/2019   PLT 270 12/16/2019   Lab Results  Component Value Date   NA 137 12/16/2019   K 4.6 12/16/2019   CO2 19 (L) 12/16/2019   GLUCOSE 122 (H) 12/16/2019   BUN 17 12/16/2019   CREATININE 0.93 12/16/2019   BILITOT 0.8 11/04/2019   ALKPHOS 69 11/04/2019    AST 42 (H) 11/04/2019   ALT 75 (H) 11/04/2019   PROT 6.8 11/04/2019   ALBUMIN 4.4 11/04/2019   CALCIUM 9.4 12/16/2019   GFR 72.54 11/04/2019   Lab Results  Component Value Date   CHOL 137 11/04/2019   Lab Results  Component Value Date   HDL 35.10 (L) 11/04/2019   Lab Results  Component Value Date   LDLCALC 71 11/04/2019   Lab Results  Component Value Date   TRIG 152.0 (H) 11/04/2019   Lab Results  Component Value Date   CHOLHDL 4 11/04/2019   Lab Results  Component Value Date   HGBA1C 6.4 11/04/2019      Assessment & Plan:   Problem List Items Addressed This Visit       Respiratory   Severe obstructive sleep apnea-hypopnea syndrome    States he has never had a CPAP before.  Was unable to complete in office sleep study assessment.  Did discuss in detail health risks and factors go along with untreated sleep apnea.  Patient acknowledged spouse is at bedside.  We will consider in the future getting in with a power pulmonology for a full sleep study        Other   Benign prostatic hyperplasia with nocturia    Cervical diagnosis the patient does have nocturia 2-5 times nightly.  We will can place patient on tamsulosin but has an allergy to  sulfa.  We will look for different alpha 1 to place patient on give a trial.      Prediabetes    Check labs today.  Patient reports eating a variety of foods with some snacking in between.  Patient not able to exercise currently due to back condition who is being investigated by Ortho.  Continue working on lifestyle modifications      Relevant Medications   atorvastatin (LIPITOR) 20 MG tablet   Other Relevant Orders   CBC   Comprehensive metabolic panel   Hemoglobin A1c   Class 3 severe obesity due to excess calories without serious comorbidity with body mass index (BMI) of 45.0 to 49.9 in adult [facility])    Check labs today.  Patient reports eating a variety of foods with some snacking in between.  Patient not able to exercise  currently due to back condition who is being investigated by Ortho.  Continue working on lifestyle modifications      Elevated lipids   Relevant Medications   atorvastatin (LIPITOR) 20 MG tablet   Other Relevant Orders   Lipid panel   Anxiety and depression    Did administer PHQ-9 and GAD-7 today patient is on the high side, PHQ-9 do not think his depression after a detailed discussion with patient considering he is having what sounds like sleep apnea along with nocturia patient is not getting restful sleep and its adding to the number the PHQ-9 patient actually titrated up on sertraline and is felt much better over the past 6 months we will continue to monitor continue sertraline 100 mg nightly.      Relevant Medications   sertraline (ZOLOFT) 100 MG tablet   Other Visit Diagnoses     Other fatigue    -  Primary   Relevant Orders   TSH   Screening for prostate cancer       Relevant Orders   PSA       No orders of the defined types were placed in this encounter.   Follow-up: No follow-ups on file.    Romilda Garret, NP

## 2020-11-05 NOTE — Assessment & Plan Note (Signed)
Check labs today.  Patient reports eating a variety of foods with some snacking in between.  Patient not able to exercise currently due to back condition who is being investigated by Ortho.  Continue working on lifestyle modifications

## 2020-11-05 NOTE — Assessment & Plan Note (Signed)
Did administer PHQ-9 and GAD-7 today patient is on the high side, PHQ-9 do not think his depression after a detailed discussion with patient considering he is having what sounds like sleep apnea along with nocturia patient is not getting restful sleep and its adding to the number the PHQ-9 patient actually titrated up on sertraline and is felt much better over the past 6 months we will continue to monitor continue sertraline 100 mg nightly.

## 2020-11-05 NOTE — Assessment & Plan Note (Signed)
States he has never had a CPAP before.  Was unable to complete in office sleep study assessment.  Did discuss in detail health risks and factors go along with untreated sleep apnea.  Patient acknowledged spouse is at bedside.  We will consider in the future getting in with a power pulmonology for a full sleep study

## 2020-11-05 NOTE — Patient Instructions (Signed)
6 month follow up

## 2020-11-06 LAB — COMPREHENSIVE METABOLIC PANEL
ALT: 63 U/L — ABNORMAL HIGH (ref 0–53)
AST: 54 U/L — ABNORMAL HIGH (ref 0–37)
Albumin: 4.2 g/dL (ref 3.5–5.2)
Alkaline Phosphatase: 71 U/L (ref 39–117)
BUN: 16 mg/dL (ref 6–23)
CO2: 26 mEq/L (ref 19–32)
Calcium: 9.1 mg/dL (ref 8.4–10.5)
Chloride: 102 mEq/L (ref 96–112)
Creatinine, Ser: 0.94 mg/dL (ref 0.40–1.50)
GFR: 87.03 mL/min (ref >60.00)
Glucose, Bld: 231 mg/dL — ABNORMAL HIGH (ref 70–99)
Potassium: 4.1 mEq/L (ref 3.5–5.1)
Sodium: 136 mEq/L (ref 135–145)
Total Bilirubin: 0.4 mg/dL (ref 0.2–1.2)
Total Protein: 6.7 g/dL (ref 6.0–8.3)

## 2020-11-06 LAB — LIPID PANEL
Cholesterol: 102 mg/dL (ref 0–200)
HDL: 32 mg/dL — ABNORMAL LOW (ref >39.00)
NonHDL: 70.31
Total CHOL/HDL Ratio: 3
Triglycerides: 209 mg/dL — ABNORMAL HIGH (ref 0.0–149.0)
VLDL: 41.8 mg/dL — ABNORMAL HIGH (ref 0.0–40.0)

## 2020-11-06 LAB — CBC
HCT: 45.6 % (ref 39.0–52.0)
Hemoglobin: 15.5 g/dL (ref 13.0–17.0)
MCHC: 33.9 g/dL (ref 30.0–36.0)
MCV: 96.6 fl (ref 78.0–100.0)
Platelets: 223 10*3/uL (ref 150.0–400.0)
RBC: 4.72 Mil/uL (ref 4.22–5.81)
RDW: 13.5 % (ref 11.5–15.5)
WBC: 8.2 10*3/uL (ref 4.0–10.5)

## 2020-11-06 LAB — HEMOGLOBIN A1C: Hgb A1c MFr Bld: 6.9 % — ABNORMAL HIGH (ref 4.6–6.5)

## 2020-11-06 LAB — LDL CHOLESTEROL, DIRECT: Direct LDL: 48 mg/dL

## 2020-11-06 LAB — TSH: TSH: 2.17 u[IU]/mL (ref 0.35–5.50)

## 2020-11-06 LAB — PSA: PSA: 2.3 ng/mL (ref 0.10–4.00)

## 2020-11-09 ENCOUNTER — Other Ambulatory Visit: Payer: Self-pay | Admitting: Nurse Practitioner

## 2020-11-09 DIAGNOSIS — E1165 Type 2 diabetes mellitus with hyperglycemia: Secondary | ICD-10-CM

## 2020-11-09 MED ORDER — METFORMIN HCL 500 MG PO TABS: 500.0000 mg | ORAL_TABLET | Freq: Every day | ORAL | 1 refills | Status: DC

## 2020-11-10 ENCOUNTER — Encounter: Payer: Self-pay | Admitting: Nurse Practitioner

## 2020-11-10 ENCOUNTER — Other Ambulatory Visit: Payer: Self-pay

## 2020-11-10 ENCOUNTER — Ambulatory Visit: Payer: Managed Care, Other (non HMO) | Admitting: Nurse Practitioner

## 2020-11-10 VITALS — BP 132/100 | HR 90 | Temp 97.3°F | Resp 20 | Ht 67.75 in | Wt 309.4 lb

## 2020-11-10 DIAGNOSIS — N6342 Unspecified lump in left breast, subareolar: Secondary | ICD-10-CM | POA: Insufficient documentation

## 2020-11-10 HISTORY — DX: Unspecified lump in left breast, subareolar: N63.42

## 2020-11-10 NOTE — Progress Notes (Signed)
Acute Office Visit  Subjective:    Patient ID: Mitchell Lewis, male    DOB: 07/28/1960, 60 y.o.   MRN: [id number]  Chief Complaint  Patient presents with   Breast Mass    Lump/bump/swelling near left nipple area. X 1 month. Sore. Not sure if the ares has gotten bigger or not.    HPI Patient is in today for Mass For approx a month. Not sure if it has gotten bigger or smaller. No nipple discharge nor nipple changes. Mother had breast cancer that she did radiation for. Skin is not discolored but there is tenderness.  Past Medical History:  Diagnosis Date   Colon polyp    Fatty liver    History of colon polyps 2018   Sleep apnea     Past Surgical History:  Procedure Laterality Date   COLONOSCOPY  04/27/2020   RIGHT/LEFT HEART CATH AND CORONARY ANGIOGRAPHY Bilateral 12/24/2019   Procedure: RIGHT/LEFT HEART CATH AND CORONARY ANGIOGRAPHY;  Surgeon: Nelva Bush, MD;  Location: [facility];  Service: Cardiovascular;  Laterality: Bilateral;   TMJ ARTHROSCOPY  1993   VASECTOMY     WISDOM TOOTH EXTRACTION  03/17/2017    Family History  Problem Relation Age of Onset   Diabetes Mother    Cancer Father        pancreatic   Heart disease Father    Kidney failure Father    Cancer Paternal Grandmother        prostate   Prostate cancer Paternal Grandfather     Social History   Socioeconomic History   Marital status: Married    Spouse name: Not on file   Number of children: 2   Years of education: Not on file   Highest education level: Not on file  Occupational History   Occupation: full time Super  Tobacco Use   Smoking status: Former    Packs/day: 1.00    Years: 40.00    Pack years: 40.00    Types: Cigarettes    Quit date: 05/15/2013    Years since quitting: 7.4   Smokeless tobacco: Never  Vaping Use   Vaping Use: Never used  Substance and Sexual Activity   Alcohol use: Yes    Alcohol/week: 1.0 standard drink    Types: 1 Standard drinks or equivalent  per week   Drug use: No   Sexual activity: Not on file  Other Topics Concern   Not on file  Social History Narrative   Not on file   Social Determinants of Health   Financial Resource Strain: Not on file  Food Insecurity: Not on file  Transportation Needs: Not on file  Physical Activity: Not on file  Stress: Not on file  Social Connections: Not on file  Intimate Partner Violence: Not on file    Outpatient Medications Prior to Visit  Medication Sig Dispense Refill   Accu-Chek Softclix Lancets lancets Use as instructed daily to check sugars. DX R73.03 100 each 12   atorvastatin (LIPITOR) 20 MG tablet Take 1 tablet (20 mg total) by mouth daily. 90 tablet 3   Blood Glucose Monitoring Suppl (ACCU-CHEK GUIDE ME) w/Device KIT USE DAILY AS NEEDED TO CHECK SUGAR 1 kit 0   Cholecalciferol (DIALYVITE VITAMIN D 5000) 125 MCG (5000 UT) capsule Take 5,000 Units by mouth daily.     cyclobenzaprine (FLEXERIL) 10 MG tablet Take 1 tablet (10 mg total) by mouth at bedtime. 10 tablet 0   diphenhydrAMINE (BENADRYL) 25 MG tablet Take  25 mg by mouth at bedtime as needed for sleep.     doxazosin (CARDURA) 1 MG tablet Take 1 tablet (1 mg total) by mouth daily. 30 tablet 1   glucose blood (ACCU-CHEK GUIDE) test strip Use as needed daily to check sugars. Dx R73.03 100 each 12   ibuprofen (ADVIL) 200 MG tablet Take 200-400 mg by mouth every 6 (six) hours as needed for headache or moderate pain.     meloxicam (MOBIC) 15 MG tablet Take 1 tablet (15 mg total) by mouth daily. 30 tablet 0   Menthol, Topical Analgesic, (BIOFREEZE EX) Apply 1 patch topically daily as needed (pain).      metFORMIN (GLUCOPHAGE) 500 MG tablet Take 1 tablet (500 mg total) by mouth daily with breakfast. 90 tablet 1   Pediatric Multivitamins-Iron (FLINTSTONES COMPLETE PO) Take 1 tablet by mouth daily.     sertraline (ZOLOFT) 100 MG tablet Take 1 tablet (100 mg total) by mouth daily. 30 tablet 3   zinc gluconate 50 MG tablet Take 50 mg by  mouth daily.     No facility-administered medications prior to visit.    Allergies  Allergen Reactions   Other Shortness Of Breath and Itching    Chicken feathers   Sulfa Antibiotics Rash and Hives    Review of Systems  Constitutional:  Negative for chills and fever.  Respiratory:  Negative for shortness of breath.   Cardiovascular:  Negative for chest pain.  Gastrointestinal:  Negative for diarrhea, nausea and vomiting.  Skin:  Negative for color change, rash and wound.       Lump in left breast      Objective:    Physical Exam Vitals and nursing note reviewed.  Cardiovascular:     Rate and Rhythm: Normal rate and regular rhythm.  Pulmonary:     Effort: Pulmonary effort is normal.     Breath sounds: Normal breath sounds.  Abdominal:     General: Bowel sounds are normal.  Skin:    General: Skin is warm.     Findings: No erythema or rash.       Neurological:     Mental Status: He is alert.  Psychiatric:        Mood and Affect: Mood normal.        Behavior: Behavior normal.        Thought Content: Thought content normal.        Judgment: Judgment normal.    BP (!) 132/100   Pulse 90   Temp (!) 97.3 F (36.3 C)   Resp 20   Ht 5' 7.75" (1.721 m)   Wt (!) 309 lb 6 oz (140.3 kg)   SpO2 93%   BMI 47.39 kg/m  Wt Readings from Last 3 Encounters:  11/10/20 (!) 309 lb 6 oz (140.3 kg)  11/05/20 (!) 309 lb 2 oz (140.2 kg)  11/03/20 (!) 305 lb (138.3 kg)    Health Maintenance Due  Topic Date Due   HIV Screening  Never done   Hepatitis C Screening  Never done   Zoster Vaccines- Shingrix (1 of 2) Never done    There are no preventive care reminders to display for this patient.   Lab Results  Component Value Date   TSH 2.17 11/05/2020   Lab Results  Component Value Date   WBC 8.2 11/05/2020   HGB 15.5 11/05/2020   HCT 45.6 11/05/2020   MCV 96.6 11/05/2020   PLT 223.0 11/05/2020   Lab Results  Component  Value Date   NA 136 11/05/2020   K 4.1  11/05/2020   CO2 26 11/05/2020   GLUCOSE 231 (H) 11/05/2020   BUN 16 11/05/2020   CREATININE 0.94 11/05/2020   BILITOT 0.4 11/05/2020   ALKPHOS 71 11/05/2020   AST 54 (H) 11/05/2020   ALT 63 (H) 11/05/2020   PROT 6.7 11/05/2020   ALBUMIN 4.2 11/05/2020   CALCIUM 9.1 11/05/2020   GFR 87.03 11/05/2020   Lab Results  Component Value Date   CHOL 102 11/05/2020   Lab Results  Component Value Date   HDL 32.00 (L) 11/05/2020   Lab Results  Component Value Date   LDLCALC 71 11/04/2019   Lab Results  Component Value Date   TRIG 209.0 (H) 11/05/2020   Lab Results  Component Value Date   CHOLHDL 3 11/05/2020   Lab Results  Component Value Date   HGBA1C 6.9 (H) 11/05/2020       Assessment & Plan:   Problem List Items Addressed This Visit       Other   Subareolar mass of left breast - Primary    Patient has mobile, tender mass to superior slightly lateral left breast above nipple.  Skin is intact and nipple discharge no nipple changes.  Patient states has been there approximately 1 month unsure if it is changed in size.  Is tender with palpation.  Mother does have history of breast cancer at age 47.  She was treated with radiation.  Given exam I do not think it is abscess so we will not treat with antibiotics.  Will pursue ultrasound of breast to determine etiology.  Patient is aware of treatment plan and agreements.  Continue to monitor      Relevant Orders   US BREAST COMPLETE UNI LEFT INC AXILLA     No orders of the defined types were placed in this encounter.  This visit occurred during the SARS-CoV-2 public health emergency.  Safety protocols were in place, including screening questions prior to the visit, additional usage of staff PPE, and extensive cleaning of exam room while observing appropriate contact time as indicated for disinfecting solutions.   Romilda Garret, NP

## 2020-11-10 NOTE — Assessment & Plan Note (Signed)
Patient has mobile, tender mass to superior slightly lateral left breast above nipple.  Skin is intact and nipple discharge no nipple changes.  Patient states has been there approximately 1 month unsure if it is changed in size.  Is tender with palpation.  Mother does have history of breast cancer at age 62.  She was treated with radiation.  Given exam I do not think it is abscess so we will not treat with antibiotics.  Will pursue ultrasound of breast to determine etiology.  Patient is aware of treatment plan and agreements.  Continue to monitor

## 2020-11-10 NOTE — Patient Instructions (Signed)
You can call to schedule an appointment Let me know if any changes Will be in touch regarding results

## 2020-11-12 ENCOUNTER — Other Ambulatory Visit: Payer: Self-pay | Admitting: Nurse Practitioner

## 2020-11-12 DIAGNOSIS — N6342 Unspecified lump in left breast, subareolar: Secondary | ICD-10-CM

## 2020-11-25 ENCOUNTER — Other Ambulatory Visit: Payer: Self-pay

## 2020-11-25 ENCOUNTER — Ambulatory Visit
Admission: RE | Admit: 2020-11-25 | Discharge: 2020-11-25 | Disposition: A | Discharge status: Home / Self Care | Payer: Managed Care, Other (non HMO) | Source: Ambulatory Visit | Attending: Nurse Practitioner | Admitting: Nurse Practitioner

## 2020-11-25 ENCOUNTER — Ambulatory Visit: Payer: Managed Care, Other (non HMO)

## 2020-11-25 DIAGNOSIS — N6342 Unspecified lump in left breast, subareolar: Secondary | ICD-10-CM

## 2020-11-27 ENCOUNTER — Encounter: Payer: Self-pay | Admitting: Nurse Practitioner

## 2020-11-27 DIAGNOSIS — N401 Enlarged prostate with lower urinary tract symptoms: Secondary | ICD-10-CM

## 2020-11-27 MED ORDER — SERTRALINE HCL 100 MG PO TABS: 100.0000 mg | ORAL_TABLET | Freq: Every day | ORAL | 3 refills | Status: DC

## 2020-11-27 MED ORDER — DOXAZOSIN MESYLATE 1 MG PO TABS: 1.0000 mg | ORAL_TABLET | Freq: Every day | ORAL | 3 refills | Status: DC

## 2020-11-27 NOTE — Telephone Encounter (Signed)
Please review. I pulled both medications down for review

## 2020-12-01 ENCOUNTER — Other Ambulatory Visit: Payer: Self-pay | Admitting: Nurse Practitioner

## 2020-12-01 ENCOUNTER — Ambulatory Visit: Payer: Managed Care, Other (non HMO) | Admitting: Nurse Practitioner

## 2020-12-01 ENCOUNTER — Encounter: Payer: Self-pay | Admitting: Nurse Practitioner

## 2020-12-01 ENCOUNTER — Other Ambulatory Visit: Payer: Self-pay

## 2020-12-01 ENCOUNTER — Telehealth: Payer: Self-pay | Admitting: Nurse Practitioner

## 2020-12-01 VITALS — BP 148/76 | HR 90 | Temp 98.3°F | Resp 20 | Ht 67.75 in | Wt 310.0 lb

## 2020-12-01 DIAGNOSIS — E1165 Type 2 diabetes mellitus with hyperglycemia: Secondary | ICD-10-CM

## 2020-12-01 DIAGNOSIS — E291 Testicular hypofunction: Secondary | ICD-10-CM | POA: Diagnosis not present

## 2020-12-01 DIAGNOSIS — N401 Enlarged prostate with lower urinary tract symptoms: Secondary | ICD-10-CM

## 2020-12-01 DIAGNOSIS — L84 Corns and callosities: Secondary | ICD-10-CM

## 2020-12-01 DIAGNOSIS — N62 Hypertrophy of breast: Secondary | ICD-10-CM | POA: Diagnosis not present

## 2020-12-01 DIAGNOSIS — R7989 Other specified abnormal findings of blood chemistry: Secondary | ICD-10-CM

## 2020-12-01 DIAGNOSIS — R351 Nocturia: Secondary | ICD-10-CM

## 2020-12-01 LAB — HEPATIC FUNCTION PANEL
ALT: 62 U/L — ABNORMAL HIGH (ref 0–53)
AST: 34 U/L (ref 0–37)
Albumin: 4.6 g/dL (ref 3.5–5.2)
Alkaline Phosphatase: 68 U/L (ref 39–117)
Bilirubin, Direct: 0.1 mg/dL (ref 0.0–0.3)
Total Bilirubin: 0.5 mg/dL (ref 0.2–1.2)
Total Protein: 7.4 g/dL (ref 6.0–8.3)

## 2020-12-01 LAB — TESTOSTERONE: Testosterone: 112.12 ng/dL — ABNORMAL LOW (ref 300.00–890.00)

## 2020-12-01 MED ORDER — TRULICITY 0.75 MG/0.5ML ~~LOC~~ SOAJ
0.7500 mg | SUBCUTANEOUS | 0 refills | Status: DC
Start: 2020-12-01 — End: 2020-12-29

## 2020-12-01 MED ORDER — OZEMPIC (0.25 OR 0.5 MG/DOSE) 2 MG/1.5ML ~~LOC~~ SOPN: 0.2500 mg | PEN_INJECTOR | SUBCUTANEOUS | 0 refills | Status: DC

## 2020-12-01 NOTE — Assessment & Plan Note (Signed)
Allises to left distal dorsal foot.  Continue to monitor continue treatments that he has been doing at home.  Stay moisturized

## 2020-12-01 NOTE — Assessment & Plan Note (Signed)
Patient having intolerances to metformin.  Did discuss about switching to extended release.  Upon further discussion we did decide to go with a GLP-1 regards to glucose control along with weight loss which may help him in a multifactorial way.

## 2020-12-01 NOTE — Patient Instructions (Signed)
Nice to see you today We will STOP metformin.  Start ozempic, the injection once a week. Will do 0.25mg  once a week for a month and then on the second month we will do 0.5mg  once a week there forward

## 2020-12-01 NOTE — Telephone Encounter (Signed)
I got notice from pharmacy and states the ozempic is not covered. I cancelled that script and sent in trulicity. It is still once a week injection but the dosing is different.  He will do 0.75mg  once a week for a month and then will switch pens to 1.5mg  once a week there forward. Please let him know

## 2020-12-01 NOTE — Assessment & Plan Note (Signed)
States that the doxazosin is being beneficial.  We will continue

## 2020-12-01 NOTE — Telephone Encounter (Signed)
Spoke with pharmacist, Ozempic and Trulicity they need PA first. Which one do you want patient to try and be on? And then I will start PA

## 2020-12-01 NOTE — Progress Notes (Signed)
Established Patient Office Visit  Subjective:  Patient ID: Mitchell Lewis., male    DOB: 11-05-1960  Age: 60 y.o. MRN: [id number]  CC:  Chief Complaint  Patient presents with   Medication Management    Metformin giving foul taste in his mouth and upset stomach   Labs Only    Discuss Testosterone level if that is the best option in his case   Foot Problem    Look at the skin on the top of left toes-questionable fungus     HPI Mitchell Lewis. presents for   Medication Management: States that he is having a bad taste in his mouth. GI upset.  Gynecomastia: Did a mammogram and showed gynecomastia. States that he has been on testosterone replacements in the past. The injectable Testerone. Hx of vasectomy. No complaints of testicular change or pain.  Skin problem: On the left lower foot, dorsal side. Callused skin on exam. Patient states wife thinks it is a fungus. Patient states it does not hurt nor does it itch. States he will use a device and shave the skin down in the shower on occasion also will get it shaved while getting pedicure.  Past Medical History:  Diagnosis Date   Colon polyp    Fatty liver    History of colon polyps 2018   Sleep apnea     Past Surgical History:  Procedure Laterality Date   COLONOSCOPY  04/27/2020   RIGHT/LEFT HEART CATH AND CORONARY ANGIOGRAPHY Bilateral 12/24/2019   Procedure: RIGHT/LEFT HEART CATH AND CORONARY ANGIOGRAPHY;  Surgeon: Nelva Bush, MD;  Location: [facility];  Service: Cardiovascular;  Laterality: Bilateral;   TMJ ARTHROSCOPY  1993   VASECTOMY     WISDOM TOOTH EXTRACTION  03/17/2017    Family History  Problem Relation Age of Onset   Breast cancer Mother 104   Cancer Mother 22       Breast cancer with radition   Diabetes Mother    Cancer Father        pancreatic   Heart disease Father    Kidney failure Father    Cancer Paternal Grandmother        prostate   Prostate cancer Paternal Grandfather      Social History   Socioeconomic History   Marital status: Married    Spouse name: Not on file   Number of children: 2   Years of education: Not on file   Highest education level: Not on file  Occupational History   Occupation: full time Super  Tobacco Use   Smoking status: Former    Packs/day: 1.00    Years: 40.00    Pack years: 40.00    Types: Cigarettes    Quit date: 05/15/2013    Years since quitting: 7.5   Smokeless tobacco: Never  Vaping Use   Vaping Use: Never used  Substance and Sexual Activity   Alcohol use: Yes    Alcohol/week: 1.0 standard drink    Types: 1 Standard drinks or equivalent per week   Drug use: No   Sexual activity: Not on file  Other Topics Concern   Not on file  Social History Narrative   Not on file   Social Determinants of Health   Financial Resource Strain: Not on file  Food Insecurity: Not on file  Transportation Needs: Not on file  Physical Activity: Not on file  Stress: Not on file  Social Connections: Not on file  Intimate Partner Violence: Not  on file    Outpatient Medications Prior to Visit  Medication Sig Dispense Refill   Accu-Chek Softclix Lancets lancets Use as instructed daily to check sugars. DX R73.03 100 each 12   atorvastatin (LIPITOR) 20 MG tablet Take 1 tablet (20 mg total) by mouth daily. 90 tablet 3   Blood Glucose Monitoring Suppl (ACCU-CHEK GUIDE ME) w/Device KIT USE DAILY AS NEEDED TO CHECK SUGAR 1 kit 0   Cholecalciferol (DIALYVITE VITAMIN D 5000) 125 MCG (5000 UT) capsule Take 5,000 Units by mouth daily.     cyclobenzaprine (FLEXERIL) 10 MG tablet Take 1 tablet (10 mg total) by mouth at bedtime. 10 tablet 0   diphenhydrAMINE (BENADRYL) 25 MG tablet Take 25 mg by mouth at bedtime as needed for sleep.     doxazosin (CARDURA) 1 MG tablet Take 1 tablet (1 mg total) by mouth daily. 90 tablet 3   glucose blood (ACCU-CHEK GUIDE) test strip Use as needed daily to check sugars. Dx R73.03 100 each 12   ibuprofen  (ADVIL) 200 MG tablet Take 200-400 mg by mouth every 6 (six) hours as needed for headache or moderate pain.     meloxicam (MOBIC) 15 MG tablet Take 1 tablet (15 mg total) by mouth daily. 30 tablet 0   Menthol, Topical Analgesic, (BIOFREEZE EX) Apply 1 patch topically daily as needed (pain).      metFORMIN (GLUCOPHAGE) 500 MG tablet Take 1 tablet (500 mg total) by mouth daily with breakfast. 90 tablet 1   Pediatric Multivitamins-Iron (FLINTSTONES COMPLETE PO) Take 1 tablet by mouth daily.     sertraline (ZOLOFT) 100 MG tablet Take 1 tablet (100 mg total) by mouth daily. 90 tablet 3   zinc gluconate 50 MG tablet Take 50 mg by mouth daily.     No facility-administered medications prior to visit.    Allergies  Allergen Reactions   Other Shortness Of Breath and Itching    Chicken feathers   Sulfa Antibiotics Rash and Hives    ROS Review of Systems  Constitutional:  Negative for chills and fever.  Respiratory:  Negative for cough and shortness of breath.   Cardiovascular:  Negative for chest pain.  Gastrointestinal:  Negative for constipation, nausea and vomiting.  Genitourinary:  Negative for penile discharge, penile pain, penile swelling, scrotal swelling and testicular pain.  Skin:  Positive for rash. Negative for color change.  Neurological:  Negative for weakness.     Objective:    Physical Exam Vitals and nursing note reviewed. Exam conducted with a chaperone present Jagdeep [facility], CMA).  Constitutional:      Appearance: He is obese.  Cardiovascular:     Rate and Rhythm: Normal rate and regular rhythm.  Pulmonary:     Effort: Pulmonary effort is normal.     Breath sounds: Normal breath sounds.  Abdominal:     General: Bowel sounds are normal.  Genitourinary:    Penis: Normal and circumcised.      Testes: Normal.     Epididymis:     Right: Normal.     Left: Normal.  Lymphadenopathy:     Lower Body: No right inguinal adenopathy. No left inguinal adenopathy.   Skin:    General: Skin is warm.       Neurological:     Mental Status: He is alert.    BP (!) 148/76   Pulse 90   Temp 98.3 F (36.8 C)   Resp 20   Ht 5' 7.75" (1.721 m)   Wt (!)  310 lb (140.6 kg)   SpO2 93%   BMI 47.48 kg/m  Wt Readings from Last 3 Encounters:  12/01/20 (!) 310 lb (140.6 kg)  11/10/20 (!) 309 lb 6 oz (140.3 kg)  11/05/20 (!) 309 lb 2 oz (140.2 kg)     Health Maintenance Due  Topic Date Due   Pneumococcal Vaccine 56-41 Years old (1 - PCV) Never done   HIV Screening  Never done   Hepatitis C Screening  Never done   Zoster Vaccines- Shingrix (1 of 2) Never done    There are no preventive care reminders to display for this patient.  Lab Results  Component Value Date   TSH 2.17 11/05/2020   Lab Results  Component Value Date   WBC 8.2 11/05/2020   HGB 15.5 11/05/2020   HCT 45.6 11/05/2020   MCV 96.6 11/05/2020   PLT 223.0 11/05/2020   Lab Results  Component Value Date   NA 136 11/05/2020   K 4.1 11/05/2020   CO2 26 11/05/2020   GLUCOSE 231 (H) 11/05/2020   BUN 16 11/05/2020   CREATININE 0.94 11/05/2020   BILITOT 0.4 11/05/2020   ALKPHOS 71 11/05/2020   AST 54 (H) 11/05/2020   ALT 63 (H) 11/05/2020   PROT 6.7 11/05/2020   ALBUMIN 4.2 11/05/2020   CALCIUM 9.1 11/05/2020   GFR 87.03 11/05/2020   Lab Results  Component Value Date   CHOL 102 11/05/2020   Lab Results  Component Value Date   HDL 32.00 (L) 11/05/2020   Lab Results  Component Value Date   LDLCALC 71 11/04/2019   Lab Results  Component Value Date   TRIG 209.0 (H) 11/05/2020   Lab Results  Component Value Date   CHOLHDL 3 11/05/2020   Lab Results  Component Value Date   HGBA1C 6.9 (H) 11/05/2020      Assessment & Plan:   Problem List Items Addressed This Visit       Endocrine   Hypogonadism in male    Patient has a history of the same.  States he was on Depo testosterone in the past.  We will check testosterone levels today      Type 2 diabetes  mellitus with hyperglycemia, without long-term current use of insulin ([place])    Patient having intolerances to metformin.  Did discuss about switching to extended release.  Upon further discussion we did decide to go with a GLP-1 regards to glucose control along with weight loss which may help him in a multifactorial way.      Relevant Medications   Semaglutide,0.25 or 0.5MG/DOS, (OZEMPIC, 0.25 OR 0.5 MG/DOSE,) 2 MG/1.5ML SOPN     Musculoskeletal and Integument   Callus of foot    Allises to left distal dorsal foot.  Continue to monitor continue treatments that he has been doing at home.  Stay moisturized        Other   Benign prostatic hyperplasia with nocturia    States that the doxazosin is being beneficial.  We will continue      Gynecomastia    Mammography with diagnosis of gynecomastia of the left breast.  We do medication review and the only 1 factor possibly calls symptoms are the atorvastatin.  Did have discussion patient needs to be on atorvastatin he is also obese discussed about adipose tissue and estrogen storage.  Also discussed about possible low testosterone being in issue.  We will check testosterone levels today and switch medication to help with weight loss in regards  to diabetes.      Relevant Orders   Testosterone   Elevated LFTs - Primary    Noticed the same on previous blood work we will recheck hepatic function panel today.      Relevant Orders   Hepatic function panel    No orders of the defined types were placed in this encounter.   Follow-up: Return for As scheduled, sooner if needed.   This visit occurred during the SARS-CoV-2 public health emergency.  Safety protocols were in place, including screening questions prior to the visit, additional usage of staff PPE, and extensive cleaning of exam room while observing appropriate contact time as indicated for disinfecting solutions.   Romilda Garret, NP

## 2020-12-01 NOTE — Progress Notes (Signed)
Called pharmacy and discontinued ozempic and start trulicity due to insurance

## 2020-12-01 NOTE — Assessment & Plan Note (Signed)
Mammography with diagnosis of gynecomastia of the left breast.  We do medication review and the only 1 factor possibly calls symptoms are the atorvastatin.  Did have discussion patient needs to be on atorvastatin he is also obese discussed about adipose tissue and estrogen storage.  Also discussed about possible low testosterone being in issue.  We will check testosterone levels today and switch medication to help with weight loss in regards to diabetes.

## 2020-12-01 NOTE — Telephone Encounter (Signed)
Patient advised and verbalized understanding 

## 2020-12-01 NOTE — Assessment & Plan Note (Signed)
Patient has a history of the same.  States he was on Depo testosterone in the past.  We will check testosterone levels today

## 2020-12-01 NOTE — Telephone Encounter (Signed)
They stated that ozempic wasn't covered that trulicity was and now trulcity isnt? Can we call the pharmacy and see what is going on please

## 2020-12-01 NOTE — Assessment & Plan Note (Signed)
Noticed the same on previous blood work we will recheck hepatic function panel today.

## 2020-12-02 NOTE — Telephone Encounter (Signed)
We can do the trulicity since that is what I sent in last. He did try metformin and could not tolerate it .  Thanks

## 2020-12-02 NOTE — Telephone Encounter (Signed)
Working on [place] now

## 2020-12-02 NOTE — Telephone Encounter (Signed)
PA done and approved, Auth # [id number]. Dates: 12/02/20-12/02/21. Called [facility] and advised of this. Called patient and let him know. RX for Bydureon suggested by the pharmacy cancelled.

## 2020-12-03 ENCOUNTER — Other Ambulatory Visit: Payer: Self-pay | Admitting: Nurse Practitioner

## 2020-12-03 DIAGNOSIS — R7989 Other specified abnormal findings of blood chemistry: Secondary | ICD-10-CM

## 2020-12-22 ENCOUNTER — Encounter: Payer: Self-pay | Admitting: Nurse Practitioner

## 2020-12-22 NOTE — Telephone Encounter (Signed)
I responded to the patient in regards to the referral. Please see question about Meloxicam. Thank you.

## 2020-12-29 ENCOUNTER — Encounter: Payer: Self-pay | Admitting: Nurse Practitioner

## 2020-12-29 ENCOUNTER — Telehealth: Payer: Self-pay | Admitting: Nurse Practitioner

## 2020-12-29 ENCOUNTER — Other Ambulatory Visit: Payer: Self-pay | Admitting: Nurse Practitioner

## 2020-12-29 DIAGNOSIS — E1165 Type 2 diabetes mellitus with hyperglycemia: Secondary | ICD-10-CM

## 2020-12-29 MED ORDER — TRULICITY 0.75 MG/0.5ML ~~LOC~~ SOAJ: 0.7500 mg | SUBCUTANEOUS | 0 refills | Status: DC

## 2020-12-29 NOTE — Telephone Encounter (Signed)
Refill request already received through [employer] and sent over to [facility] for review. Please review urology question

## 2020-12-29 NOTE — Telephone Encounter (Signed)
He can get on the cancellation list or we can try another urologist. I will review and get his trulicity sent in

## 2020-12-29 NOTE — Telephone Encounter (Signed)
Mitchell Lewis called in and stated that he is out of the trulictiy and had questions about getting a refill and getting a 90 supply. And he stated he called the urologist and they told him that they can see him at the end of January and wanted to know if its something else that can be done.

## 2020-12-29 NOTE — Telephone Encounter (Signed)
Patient advised. Patient will call [facility] office and see how soon they could see him and if he wants to go to their office and needs a referral he will let us know. Patient did not have a good experience with Dr Bernardo Heater and would not want to see him again (last visit with him was in 2019)

## 2020-12-31 ENCOUNTER — Ambulatory Visit: Payer: Managed Care, Other (non HMO) | Admitting: Sports Medicine

## 2020-12-31 ENCOUNTER — Other Ambulatory Visit: Payer: Self-pay | Admitting: Family

## 2020-12-31 ENCOUNTER — Other Ambulatory Visit: Payer: Self-pay

## 2020-12-31 VITALS — BP 130/80 | HR 93 | Ht 67.75 in | Wt 310.0 lb

## 2020-12-31 DIAGNOSIS — F419 Anxiety disorder, unspecified: Secondary | ICD-10-CM

## 2020-12-31 DIAGNOSIS — F32A Depression, unspecified: Secondary | ICD-10-CM

## 2020-12-31 DIAGNOSIS — M47816 Spondylosis without myelopathy or radiculopathy, lumbar region: Secondary | ICD-10-CM

## 2020-12-31 DIAGNOSIS — M5136 Other intervertebral disc degeneration, lumbar region: Secondary | ICD-10-CM

## 2020-12-31 DIAGNOSIS — M4807 Spinal stenosis, lumbosacral region: Secondary | ICD-10-CM

## 2020-12-31 DIAGNOSIS — R29898 Other symptoms and signs involving the musculoskeletal system: Secondary | ICD-10-CM | POA: Diagnosis not present

## 2020-12-31 MED ORDER — MELOXICAM 15 MG PO TABS: 15.0000 mg | ORAL_TABLET | Freq: Every day | ORAL | 0 refills | Status: DC

## 2020-12-31 NOTE — Patient Instructions (Addendum)
Good to see you Meloxicam 15mg  refill sent to take daily for tow weeks Follow up in 2 weeks

## 2020-12-31 NOTE — Progress Notes (Signed)
Mitchell Lewis D.[place] Phone: [phone]   Assessment and Plan:     1. DDD (degenerative disc disease), lumbar 2. Facet arthritis of lumbar region 3. Foraminal stenosis of lumbosacral region 4. Left leg weakness -Chronic with exacerbation, subsequent visit - Patient had significant relief of right lower back pain and right-sided radicular symptoms after steroid injection to low back, however he says he has had gradual return of symptoms now having left-sided radicular symptoms over the past 1 month - We will restart meloxicam 15 mg daily x2 weeks - May use Tylenol for breakthrough pain - Continue HEP for low back   Pertinent previous records reviewed include none   Follow Up: 2 weeks for reevaluation.  If no improvement or worsening of symptoms would consider referral for left-sided facet injection versus referral to spinal surgery   Subjective:   I, Mitchell Lewis, am serving as a scribe for Dr. Glennon Mac  Chief Complaint: Left side hip and knee pain with numbness  HPI:   12/31/20 Patient is a 60 year old male presenting with left hip pain that radiates to left knee that is accompanied by numbness. Patient states that this has been going on for a week.  Patient states that he had a back injection awhile ago that helped with his back pain and right leg pain. Patient states that he started having some left leg pain and numbness, patient also states since the injection his legs feel weak, and wanted to ask about that as well. Patient has been doing some stretching and small walks to help with the pain. Patient was previously on flexeril and Meloxicam but has been out for about 2 weeks and has not been able to see if that would help this pain.   Patient was last seen on 11/03/20 for right sided low back pain that radiated to right foot.     Relevant Historical Information: OSA, chronic back pain,  prediabetes, obesity  Additional pertinent review of systems negative.   Current Outpatient Medications:    Accu-Chek Softclix Lancets lancets, Use as instructed daily to check sugars. DX R73.03, Disp: 100 each, Rfl: 12   atorvastatin (LIPITOR) 20 MG tablet, Take 1 tablet (20 mg total) by mouth daily., Disp: 90 tablet, Rfl: 3   Blood Glucose Monitoring Suppl (ACCU-CHEK GUIDE ME) w/Device KIT, USE DAILY AS NEEDED TO CHECK SUGAR, Disp: 1 kit, Rfl: 0   Cholecalciferol (DIALYVITE VITAMIN D 5000) 125 MCG (5000 UT) capsule, Take 5,000 Units by mouth daily., Disp: , Rfl:    diphenhydrAMINE (BENADRYL) 25 MG tablet, Take 25 mg by mouth at bedtime as needed for sleep., Disp: , Rfl:    doxazosin (CARDURA) 1 MG tablet, Take 1 tablet (1 mg total) by mouth daily., Disp: 90 tablet, Rfl: 3   Dulaglutide (TRULICITY) 0.63 KZ/6.0FU SOPN, Inject 0.75 mg into the skin once a week., Disp: 6 mL, Rfl: 0   glucose blood (ACCU-CHEK GUIDE) test strip, Use as needed daily to check sugars. Dx R73.03, Disp: 100 each, Rfl: 12   ibuprofen (ADVIL) 200 MG tablet, Take 200-400 mg by mouth every 6 (six) hours as needed for headache or moderate pain., Disp: , Rfl:    Menthol, Topical Analgesic, (BIOFREEZE EX), Apply 1 patch topically daily as needed (pain). , Disp: , Rfl:    Pediatric Multivitamins-Iron (FLINTSTONES COMPLETE PO), Take 1 tablet by mouth daily., Disp: , Rfl:    sertraline (ZOLOFT) 100 MG  tablet, Take 1 tablet (100 mg total) by mouth daily., Disp: 90 tablet, Rfl: 3   zinc gluconate 50 MG tablet, Take 50 mg by mouth daily., Disp: , Rfl:    cyclobenzaprine (FLEXERIL) 10 MG tablet, Take 1 tablet (10 mg total) by mouth at bedtime. (Patient not taking: Reported on 12/31/2020), Disp: 10 tablet, Rfl: 0   meloxicam (MOBIC) 15 MG tablet, Take 1 tablet (15 mg total) by mouth daily., Disp: 30 tablet, Rfl: 0   Objective:     Vitals:   12/31/20 1513  BP: 130/80  Pulse: 93  SpO2: 95%  Weight: (!) 310 lb (140.6 kg)  Height:  5' 7.75" (1.721 m)      Body mass index is 47.48 kg/m.    Physical Exam:    Gen: Appears well, nad, nontoxic and pleasant Psych: Alert and oriented, appropriate mood and affect Neuro: Sensation generally decreased along left lower extremity without specific muscle weakness or neurologic distribution pattern.  Sensation intact, strength is 5/5 in upper and lower extremities, muscle tone wnl Skin: no susupicious lesions or rashes  Back - Normal skin, Spine with normal alignment and no deformity.   No tenderness to vertebral process palpation.   Paraspinous muscles are mildly tender and without spasm Straight leg raise negative  Electronically signed by:  Mitchell Lewis D.[facility] 3:50 PM 12/31/20

## 2021-01-14 ENCOUNTER — Ambulatory Visit: Payer: Managed Care, Other (non HMO) | Admitting: Sports Medicine

## 2021-01-14 NOTE — Progress Notes (Deleted)
Benito Mccreedy D.[place] Phone: [phone]   Assessment and Plan:     There are no diagnoses linked to this encounter.  ***   Pertinent previous records reviewed include ***   Follow Up: ***     Subjective:    I, Mitchell Lewis, am serving as a Education administrator for Doctor Glennon Mac  Chief Complaint: left side hip and knee pain   HPI:   12/31/20 Patient is a 60 year old male presenting with left hip pain that radiates to left knee that is accompanied by numbness. Patient states that this has been going on for a week.  Patient states that he had a back injection awhile ago that helped with his back pain and right leg pain. Patient states that he started having some left leg pain and numbness, patient also states since the injection his legs feel weak, and wanted to ask about that as well. Patient has been doing some stretching and small walks to help with the pain. Patient was previously on flexeril and Meloxicam but has been out for about 2 weeks and has not been able to see if that would help this pain.    Patient was last seen on 11/03/20 for right sided low back pain that radiated to right foot.     01/14/2021 Patient states    Relevant Historical Information: OSA, chronic back pain, prediabetes, obesity    Relevant Historical Information: ***  Additional pertinent review of systems negative.   Current Outpatient Medications:    Accu-Chek Softclix Lancets lancets, Use as instructed daily to check sugars. DX R73.03, Disp: 100 each, Rfl: 12   atorvastatin (LIPITOR) 20 MG tablet, Take 1 tablet (20 mg total) by mouth daily., Disp: 90 tablet, Rfl: 3   Blood Glucose Monitoring Suppl (ACCU-CHEK GUIDE ME) w/Device KIT, USE DAILY AS NEEDED TO CHECK SUGAR, Disp: 1 kit, Rfl: 0   Cholecalciferol (DIALYVITE VITAMIN D 5000) 125 MCG (5000 UT) capsule, Take 5,000 Units by mouth daily., Disp: , Rfl:     cyclobenzaprine (FLEXERIL) 10 MG tablet, Take 1 tablet (10 mg total) by mouth at bedtime. (Patient not taking: Reported on 12/31/2020), Disp: 10 tablet, Rfl: 0   diphenhydrAMINE (BENADRYL) 25 MG tablet, Take 25 mg by mouth at bedtime as needed for sleep., Disp: , Rfl:    doxazosin (CARDURA) 1 MG tablet, Take 1 tablet (1 mg total) by mouth daily., Disp: 90 tablet, Rfl: 3   Dulaglutide (TRULICITY) 4.25 ZD/6.3OV SOPN, Inject 0.75 mg into the skin once a week., Disp: 6 mL, Rfl: 0   glucose blood (ACCU-CHEK GUIDE) test strip, Use as needed daily to check sugars. Dx R73.03, Disp: 100 each, Rfl: 12   ibuprofen (ADVIL) 200 MG tablet, Take 200-400 mg by mouth every 6 (six) hours as needed for headache or moderate pain., Disp: , Rfl:    meloxicam (MOBIC) 15 MG tablet, Take 1 tablet (15 mg total) by mouth daily., Disp: 30 tablet, Rfl: 0   Menthol, Topical Analgesic, (BIOFREEZE EX), Apply 1 patch topically daily as needed (pain). , Disp: , Rfl:    Pediatric Multivitamins-Iron (FLINTSTONES COMPLETE PO), Take 1 tablet by mouth daily., Disp: , Rfl:    sertraline (ZOLOFT) 100 MG tablet, Take 1 tablet (100 mg total) by mouth daily., Disp: 90 tablet, Rfl: 3   zinc gluconate 50 MG tablet, Take 50 mg  by mouth daily., Disp: , Rfl:    Objective:     There were no vitals filed for this visit.    There is no height or weight on file to calculate BMI.    Physical Exam:    ***   Electronically signed by:  Benito Mccreedy D.[facility] 7:42 AM 01/14/21

## 2021-01-31 ENCOUNTER — Other Ambulatory Visit: Payer: Self-pay | Admitting: Sports Medicine

## 2021-02-11 ENCOUNTER — Other Ambulatory Visit: Payer: Self-pay | Admitting: Sports Medicine

## 2021-02-15 ENCOUNTER — Other Ambulatory Visit: Payer: Self-pay

## 2021-02-15 ENCOUNTER — Ambulatory Visit: Payer: Managed Care, Other (non HMO) | Admitting: Nurse Practitioner

## 2021-02-15 ENCOUNTER — Encounter: Payer: Self-pay | Admitting: Nurse Practitioner

## 2021-02-15 VITALS — BP 138/72 | HR 81 | Temp 97.9°F | Resp 14 | Ht 67.5 in | Wt 299.4 lb

## 2021-02-15 DIAGNOSIS — E1165 Type 2 diabetes mellitus with hyperglycemia: Secondary | ICD-10-CM

## 2021-02-15 DIAGNOSIS — R351 Nocturia: Secondary | ICD-10-CM

## 2021-02-15 LAB — POCT URINALYSIS DIP (CLINITEK)
Bilirubin, UA: NEGATIVE
Blood, UA: NEGATIVE
Glucose, UA: NEGATIVE mg/dL
Ketones, POC UA: NEGATIVE mg/dL
Leukocytes, UA: NEGATIVE
Nitrite, UA: NEGATIVE
POC PROTEIN,UA: NEGATIVE
Spec Grav, UA: 1.02 (ref 1.010–1.025)
Urobilinogen, UA: 0.2 E.U./dL
pH, UA: 6 (ref 5.0–8.0)

## 2021-02-15 LAB — POCT GLYCOSYLATED HEMOGLOBIN (HGB A1C): Hemoglobin A1C: 6.1 % — AB (ref 4.0–5.6)

## 2021-02-15 MED ORDER — DOXAZOSIN MESYLATE 2 MG PO TABS: 2.0000 mg | ORAL_TABLET | Freq: Every day | ORAL | 1 refills | Status: DC

## 2021-02-15 NOTE — Assessment & Plan Note (Addendum)
Was on doxazosin 1 mg daily.  Patient did well for few months and states that he feels like he is going back to his original amount of urination.  Did check UA was negative in office.  We will increase dose of doxazosin to 2 mg.  He can take 2 of the 1 mg tablets until out

## 2021-02-15 NOTE — Progress Notes (Signed)
Established Patient Office Visit  Subjective:  Patient ID: Mitchell Lewis., male    DOB: [birth year]  Age: 61 y.o. MRN: [id number]  CC:  Chief Complaint  Patient presents with   Diabetes    Follow up    HPI [name]. presents for Diabetes recheck   States that he checks his sugar first thing in the morning and it is around 97. States he will check around 3pm in between and it is at the highest 137. Has been working on doing the 18/6 fasting and eating no more than 15 carbs per day. He is discouraged about the amount of weight he has lost. But has lost 11 pounds since last visit.  Doxazosin was working well with patient and states that for the first 2 months he was doing fine and now he feels like he Is back to normal urinating approx 5-6 times at night. He does have a urology appt in Feb.  Past Medical History:  Diagnosis Date   Colon polyp    Fatty liver    History of colon polyps 2018   Prediabetes 09/15/2017   Sleep apnea    Subareolar mass of left breast 11/10/2020    Past Surgical History:  Procedure Laterality Date   COLONOSCOPY  04/27/2020   RIGHT/LEFT HEART CATH AND CORONARY ANGIOGRAPHY Bilateral 12/24/2019   Procedure: RIGHT/LEFT HEART CATH AND CORONARY ANGIOGRAPHY;  Surgeon: Nelva Bush, MD;  Location: [facility];  Service: Cardiovascular;  Laterality: Bilateral;   TMJ ARTHROSCOPY  1993   VASECTOMY     WISDOM TOOTH EXTRACTION  03/17/2017    Family History  Problem Relation Age of Onset   Breast cancer Mother 73   Cancer Mother 49       Breast cancer with radition   Diabetes Mother    Cancer Father        pancreatic   Heart disease Father    Kidney failure Father    Cancer Paternal Grandmother        prostate   Prostate cancer Paternal Grandfather     Social History   Socioeconomic History   Marital status: Married    Spouse name: Not on file   Number of children: 2   Years of education: Not on file   Highest  education level: Not on file  Occupational History   Occupation: full time Super  Tobacco Use   Smoking status: Former    Packs/day: 1.00    Years: 40.00    Pack years: 40.00    Types: Cigarettes    Quit date: 05/15/2013    Years since quitting: 7.7   Smokeless tobacco: Never  Vaping Use   Vaping Use: Never used  Substance and Sexual Activity   Alcohol use: Yes    Alcohol/week: 1.0 standard drink    Types: 1 Standard drinks or equivalent per week   Drug use: No   Sexual activity: Not on file  Other Topics Concern   Not on file  Social History Narrative   Not on file   Social Determinants of Health   Financial Resource Strain: Not on file  Food Insecurity: Not on file  Transportation Needs: Not on file  Physical Activity: Not on file  Stress: Not on file  Social Connections: Not on file  Intimate Partner Violence: Not on file    Outpatient Medications Prior to Visit  Medication Sig Dispense Refill   Accu-Chek Softclix Lancets lancets Use as instructed daily to  check sugars. DX R73.03 100 each 12   atorvastatin (LIPITOR) 20 MG tablet Take 1 tablet (20 mg total) by mouth daily. 90 tablet 3   Blood Glucose Monitoring Suppl (ACCU-CHEK GUIDE ME) w/Device KIT USE DAILY AS NEEDED TO CHECK SUGAR 1 kit 0   Cholecalciferol (DIALYVITE VITAMIN D 5000) 125 MCG (5000 UT) capsule Take 5,000 Units by mouth daily.     diphenhydrAMINE (BENADRYL) 25 MG tablet Take 25 mg by mouth at bedtime as needed for sleep.     doxazosin (CARDURA) 1 MG tablet Take 1 tablet (1 mg total) by mouth daily. 90 tablet 3   Dulaglutide (TRULICITY) 7.78 EU/2.3NT SOPN Inject 0.75 mg into the skin once a week. 6 mL 0   glucose blood (ACCU-CHEK GUIDE) test strip Use as needed daily to check sugars. Dx R73.03 100 each 12   ibuprofen (ADVIL) 200 MG tablet Take 200-400 mg by mouth every 6 (six) hours as needed for headache or moderate pain.     Pediatric Multivitamins-Iron (FLINTSTONES COMPLETE PO) Take 1 tablet by  mouth daily.     sertraline (ZOLOFT) 100 MG tablet Take 1 tablet (100 mg total) by mouth daily. 90 tablet 3   zinc gluconate 50 MG tablet Take 50 mg by mouth daily.     cyclobenzaprine (FLEXERIL) 10 MG tablet Take 1 tablet (10 mg total) by mouth at bedtime. (Patient not taking: Reported on 12/31/2020) 10 tablet 0   meloxicam (MOBIC) 15 MG tablet Take 1 tablet (15 mg total) by mouth daily. (Patient not taking: Reported on 02/15/2021) 30 tablet 0   Menthol, Topical Analgesic, (BIOFREEZE EX) Apply 1 patch topically daily as needed (pain).      No facility-administered medications prior to visit.    Allergies  Allergen Reactions   Other Shortness Of Breath and Itching    Chicken feathers   Sulfa Antibiotics Rash and Hives    ROS Review of Systems  Constitutional:  Negative for chills and fever.  Gastrointestinal:  Negative for nausea and vomiting.  Endocrine: Negative for polydipsia, polyphagia and polyuria.  Genitourinary:  Negative for dysuria, frequency and hematuria.       Nocutria x 5  Skin:  Negative for color change and wound.     Objective:    Physical Exam Vitals and nursing note reviewed.  Constitutional:      Appearance: He is obese.  Cardiovascular:     Rate and Rhythm: Normal rate and regular rhythm.     Pulses:          Dorsalis pedis pulses are 1+ on the right side and 1+ on the left side.     Heart sounds: Normal heart sounds.  Pulmonary:     Effort: Pulmonary effort is normal.     Breath sounds: Normal breath sounds.  Abdominal:     General: Bowel sounds are normal.  Skin:    General: Skin is warm.     Comments: Right great toe lateral side has a callus  Neurological:     Mental Status: He is alert.    BP 138/72    Pulse 81    Temp 97.9 F (36.6 C)    Resp 14    Ht 5' 7.5" (1.715 m)    Wt 299 lb 6 oz (135.8 kg)    SpO2 95%    BMI 46.20 kg/m  Wt Readings from Last 3 Encounters:  02/15/21 299 lb 6 oz (135.8 kg)  12/31/20 (!) 310 lb (140.6 kg)  12/01/20  Marland Kitchen)  310 lb (140.6 kg)     Health Maintenance Due  Topic Date Due   Pneumococcal Vaccine 41-84 Years old (1 - PCV) Never done   FOOT EXAM  Never done   OPHTHALMOLOGY EXAM  Never done   HIV Screening  Never done   Hepatitis C Screening  Never done   Zoster Vaccines- Shingrix (1 of 2) Never done    There are no preventive care reminders to display for this patient.  Lab Results  Component Value Date   TSH 2.17 11/05/2020   Lab Results  Component Value Date   WBC 8.2 11/05/2020   HGB 15.5 11/05/2020   HCT 45.6 11/05/2020   MCV 96.6 11/05/2020   PLT 223.0 11/05/2020   Lab Results  Component Value Date   NA 136 11/05/2020   K 4.1 11/05/2020   CO2 26 11/05/2020   GLUCOSE 231 (H) 11/05/2020   BUN 16 11/05/2020   CREATININE 0.94 11/05/2020   BILITOT 0.5 12/01/2020   ALKPHOS 68 12/01/2020   AST 34 12/01/2020   ALT 62 (H) 12/01/2020   PROT 7.4 12/01/2020   ALBUMIN 4.6 12/01/2020   CALCIUM 9.1 11/05/2020   GFR 87.03 11/05/2020   Lab Results  Component Value Date   CHOL 102 11/05/2020   Lab Results  Component Value Date   HDL 32.00 (L) 11/05/2020   Lab Results  Component Value Date   LDLCALC 71 11/04/2019   Lab Results  Component Value Date   TRIG 209.0 (H) 11/05/2020   Lab Results  Component Value Date   CHOLHDL 3 11/05/2020   Lab Results  Component Value Date   HGBA1C 6.9 (H) 11/05/2020      Assessment & Plan:   Problem List Items Addressed This Visit       Endocrine   Type 2 diabetes mellitus with hyperglycemia, without long-term current use of insulin ([place]) - Primary    Rechecked A1C in office and it was 6.1. Patient is currently just using diet modifications to control his glucose level. He has lost 11 pounds since the past. Continue to work on healthy lifestyle modifications and will recheck in 3 more months to make sure he is stable      Relevant Orders   POCT glycosylated hemoglobin (Hb A1C) (Completed)     Other   Nocturia    Was on  doxazosin 1 mg daily.  Patient did well for few months and states that he feels like he is going back to his original amount of urination.  Did check UA was negative in office.  We will increase dose of doxazosin to 2 mg.  He can take 2 of the 1 mg tablets until out      Relevant Medications   doxazosin (CARDURA) 2 MG tablet   Other Relevant Orders   POCT URINALYSIS DIP (CLINITEK) (Completed)    Meds ordered this encounter  Medications   doxazosin (CARDURA) 2 MG tablet    Sig: Take 1 tablet (2 mg total) by mouth daily. Do not fill until patient requests. D/C 2m script    Dispense:  90 tablet    Refill:  1    Order Specific Question:   Supervising Provider    Answer:   TLoura PardonA [[id number]]    Follow-up: Return in about 4 months (around 06/15/2021) for CPE and recheck.  This visit occurred during the SARS-CoV-2 public health emergency.  Safety protocols were in place, including screening questions prior to the visit, additional usage  of staff PPE, and extensive cleaning of exam room while observing appropriate contact time as indicated for disinfecting solutions.    This visit occurred during the SARS-CoV-2 public health emergency.  Safety protocols were in place, including screening questions prior to the visit, additional usage of staff PPE, and extensive cleaning of exam room while observing appropriate contact time as indicated for disinfecting solutions.   Romilda Garret, NP

## 2021-02-15 NOTE — Patient Instructions (Signed)
Nice to see you today We will change the Doxazosin from the 1 mg to the 2mg  daily You can take 2 tablets of the 1mg  until you finish your bottle and I will send in a new script for the 2 mg tablets  I want to see you in around 4 months to make sure we are still on the right track

## 2021-02-15 NOTE — Assessment & Plan Note (Addendum)
Rechecked A1C in office and it was 6.1. Patient is currently just using diet modifications to control his glucose level. He has lost 11 pounds since the past. Continue to work on healthy lifestyle modifications and will recheck in 3 more months to make sure he is stable

## 2021-02-25 NOTE — Progress Notes (Signed)
Mitchell Lewis D.Mitchell Lewis Phone: [phone]   Assessment and Plan:     1. DDD (degenerative disc disease), lumbar 2. Facet arthritis of lumbar region 3. Foraminal stenosis of lumbosacral region 4. Left leg weakness 5. Chronic midline low back pain with left-sided sciatica -Chronic with exacerbation, subsequent visit - Recurrence of low back pain with radicular symptoms into left leg - Patient received significant benefit after facet injection to right L4-L5, and he states that his left-sided symptoms are progressing similar to how his right side originally progressed - We will proceed with facet injection to left L4-L5 and follow-up with patient in clinic 2 weeks after this injection to review effectiveness - DG FACET JT INJ L /S SINGLE LEVEL LEFT W/FL/CT; Future    Pertinent previous records reviewed include - Reviewed outside MRI report with impression "multilevel DDD and facet arthrosis, more prominent lower lumbar spine.  At L4-5 there is grade 1 degenerative spinal listhesis and prominent posterior hypertrophic changes resulting in moderate spinal canal and left greater than right lateral recess stenosis.  Potential impingement of the traversing L5 nerve roots within the lateral recesses, particularly on the left.  There is also moderate right greater than left foraminal stenosis at this level.  Mild bilateral foraminal stenosis L5-S1"   Follow Up: - We will proceed with facet injection to left L4-L5 and follow-up with patient in clinic 2 weeks after this injection to review effectiveness.  If no improvement or worsening of symptoms, would consider referral to neurosurgery    Subjective:   I, [place], am serving as a Education administrator for Doctor Glennon Mac  Chief Complaint: left sided low back pain   HPI:  12/31/20 Patient is a 61 year old male presenting with left hip pain that radiates to left knee  that is accompanied by numbness. Patient states that this has been going on for a week.  Patient states that he had a back injection awhile ago that helped with his back pain and right leg pain. Patient states that he started having some left leg pain and numbness, patient also states since the injection his legs feel weak, and wanted to ask about that as well. Patient has been doing some stretching and small walks to help with the pain. Patient was previously on flexeril and Meloxicam but has been out for about 2 weeks and has not been able to see if that would help this pain.    Patient was last seen on 11/03/20 for right sided low back pain that radiated to right foot.     03/01/2021 Patient states that he is starting to get left leg burning, quad is numb and tingling  . Started 3 weeks ago no MOI has been taking tylenol and meloxicam and flerxerl but has been out for about a week, was helping a lot but he is out thi  Right side is doing well csi helped for a month or so    Relevant Historical Information: OSA, chronic back pain, prediabetes, obesity  Additional pertinent review of systems negative.   Current Outpatient Medications:    Accu-Chek Softclix Lancets lancets, Use as instructed daily to check sugars. DX R73.03, Disp: 100 each, Rfl: 12   atorvastatin (LIPITOR) 20 MG tablet, Take 1 tablet (20 mg total) by mouth daily., Disp: 90 tablet, Rfl: 3   Blood Glucose Monitoring Suppl (ACCU-CHEK GUIDE ME) w/Device KIT, USE DAILY AS NEEDED TO CHECK SUGAR, Disp:  1 kit, Rfl: 0   Cholecalciferol (DIALYVITE VITAMIN D 5000) 125 MCG (5000 UT) capsule, Take 5,000 Units by mouth daily., Disp: , Rfl:    diphenhydrAMINE (BENADRYL) 25 MG tablet, Take 25 mg by mouth at bedtime as needed for sleep., Disp: , Rfl:    doxazosin (CARDURA) 2 MG tablet, Take 1 tablet (2 mg total) by mouth daily. Do not fill until patient requests. D/C 61m script, Disp: 90 tablet, Rfl: 1   Dulaglutide (TRULICITY) 09.35MLE/1.7GJ SOPN, Inject 0.75 mg into the skin once a week., Disp: 6 mL, Rfl: 0   glucose blood (ACCU-CHEK GUIDE) test strip, Use as needed daily to check sugars. Dx R73.03, Disp: 100 each, Rfl: 12   ibuprofen (ADVIL) 200 MG tablet, Take 200-400 mg by mouth every 6 (six) hours as needed for headache or moderate pain., Disp: , Rfl:    Pediatric Multivitamins-Iron (FLINTSTONES COMPLETE PO), Take 1 tablet by mouth daily., Disp: , Rfl:    sertraline (ZOLOFT) 100 MG tablet, Take 1 tablet (100 mg total) by mouth daily., Disp: 90 tablet, Rfl: 3   zinc gluconate 50 MG tablet, Take 50 mg by mouth daily., Disp: , Rfl:    Objective:     Vitals:   03/01/21 1407  BP: 118/80  Pulse: 92  SpO2: 97%  Weight: (!) 301 lb (136.5 kg)  Height: '5\' 7"'  (1.702 m)      Body mass index is 47.14 kg/m.    Physical Exam:    Gen: Appears well, nad, nontoxic and pleasant Psych: Alert and oriented, appropriate mood and affect Neuro: sensation intact, strength is 5/5 in upper and lower extremities, muscle tone wnl Skin: no susupicious lesions or rashes  Back - Normal skin, Spine with normal alignment and no deformity.   No tenderness to vertebral process palpation.   Lumbar paraspinous muscles are mildly tender and without spasm Straight leg raise positive on left, negative on right     Electronically signed by:  BBenito MccreedyD.OMarguerita [facility]lesSports [facility] 3:07 PM 03/01/21

## 2021-03-01 ENCOUNTER — Other Ambulatory Visit: Payer: Self-pay

## 2021-03-01 ENCOUNTER — Ambulatory Visit: Payer: Managed Care, Other (non HMO) | Admitting: Sports Medicine

## 2021-03-01 VITALS — BP 118/80 | HR 92 | Ht 67.0 in | Wt 301.0 lb

## 2021-03-01 DIAGNOSIS — M4807 Spinal stenosis, lumbosacral region: Secondary | ICD-10-CM | POA: Diagnosis not present

## 2021-03-01 DIAGNOSIS — M5136 Other intervertebral disc degeneration, lumbar region: Secondary | ICD-10-CM

## 2021-03-01 DIAGNOSIS — R29898 Other symptoms and signs involving the musculoskeletal system: Secondary | ICD-10-CM | POA: Diagnosis not present

## 2021-03-01 DIAGNOSIS — M5442 Lumbago with sciatica, left side: Secondary | ICD-10-CM

## 2021-03-01 DIAGNOSIS — M47816 Spondylosis without myelopathy or radiculopathy, lumbar region: Secondary | ICD-10-CM | POA: Diagnosis not present

## 2021-03-01 DIAGNOSIS — G8929 Other chronic pain: Secondary | ICD-10-CM

## 2021-03-01 NOTE — Patient Instructions (Addendum)
Good to see you  Referral for injection  Follow up 2 weeks after injection to discuss

## 2021-03-08 ENCOUNTER — Ambulatory Visit
Admission: RE | Admit: 2021-03-08 | Discharge: 2021-03-08 | Disposition: A | Discharge status: Home / Self Care | Payer: Managed Care, Other (non HMO) | Source: Ambulatory Visit | Attending: Sports Medicine | Admitting: Sports Medicine

## 2021-03-08 ENCOUNTER — Other Ambulatory Visit: Payer: Self-pay

## 2021-03-08 DIAGNOSIS — R29898 Other symptoms and signs involving the musculoskeletal system: Secondary | ICD-10-CM

## 2021-03-08 DIAGNOSIS — G8929 Other chronic pain: Secondary | ICD-10-CM

## 2021-03-08 DIAGNOSIS — M5442 Lumbago with sciatica, left side: Secondary | ICD-10-CM

## 2021-03-08 DIAGNOSIS — M47816 Spondylosis without myelopathy or radiculopathy, lumbar region: Secondary | ICD-10-CM

## 2021-03-08 DIAGNOSIS — M5136 Other intervertebral disc degeneration, lumbar region: Secondary | ICD-10-CM

## 2021-03-08 DIAGNOSIS — M4807 Spinal stenosis, lumbosacral region: Secondary | ICD-10-CM

## 2021-03-08 MED ORDER — METHYLPREDNISOLONE ACETATE 40 MG/ML INJ SUSP (RADIOLOG
80.0000 mg | Freq: Once | INTRAMUSCULAR | Status: AC
  Administered 2021-03-08: 60 mg via INTRA_ARTICULAR

## 2021-03-08 MED ORDER — IOPAMIDOL (ISOVUE-M 200) INJECTION 41%
1.0000 mL | Freq: Once | INTRAMUSCULAR | Status: AC
  Administered 2021-03-08: 1 mL via INTRA_ARTICULAR

## 2021-03-08 NOTE — Discharge Instructions (Signed)

## 2021-03-26 ENCOUNTER — Other Ambulatory Visit: Payer: Self-pay | Admitting: Nurse Practitioner

## 2021-03-26 DIAGNOSIS — E1165 Type 2 diabetes mellitus with hyperglycemia: Secondary | ICD-10-CM

## 2021-06-18 ENCOUNTER — Encounter: Payer: Self-pay | Admitting: Nurse Practitioner

## 2021-06-18 ENCOUNTER — Ambulatory Visit (INDEPENDENT_AMBULATORY_CARE_PROVIDER_SITE_OTHER): Payer: Managed Care, Other (non HMO) | Admitting: Nurse Practitioner

## 2021-06-18 VITALS — BP 140/74 | HR 71 | Temp 97.6°F | Resp 18 | Ht 68.0 in | Wt 300.0 lb

## 2021-06-18 DIAGNOSIS — Z125 Encounter for screening for malignant neoplasm of prostate: Secondary | ICD-10-CM | POA: Diagnosis not present

## 2021-06-18 DIAGNOSIS — Z6841 Body Mass Index (BMI) 40.0 and over, adult: Secondary | ICD-10-CM

## 2021-06-18 DIAGNOSIS — Z0000 Encounter for general adult medical examination without abnormal findings: Secondary | ICD-10-CM | POA: Diagnosis not present

## 2021-06-18 DIAGNOSIS — F419 Anxiety disorder, unspecified: Secondary | ICD-10-CM

## 2021-06-18 DIAGNOSIS — E1165 Type 2 diabetes mellitus with hyperglycemia: Secondary | ICD-10-CM | POA: Diagnosis not present

## 2021-06-18 DIAGNOSIS — L821 Other seborrheic keratosis: Secondary | ICD-10-CM | POA: Insufficient documentation

## 2021-06-18 DIAGNOSIS — M5442 Lumbago with sciatica, left side: Secondary | ICD-10-CM | POA: Diagnosis not present

## 2021-06-18 DIAGNOSIS — Z122 Encounter for screening for malignant neoplasm of respiratory organs: Secondary | ICD-10-CM

## 2021-06-18 DIAGNOSIS — E291 Testicular hypofunction: Secondary | ICD-10-CM

## 2021-06-18 DIAGNOSIS — D225 Melanocytic nevi of trunk: Secondary | ICD-10-CM | POA: Insufficient documentation

## 2021-06-18 DIAGNOSIS — G8929 Other chronic pain: Secondary | ICD-10-CM

## 2021-06-18 DIAGNOSIS — Z85828 Personal history of other malignant neoplasm of skin: Secondary | ICD-10-CM | POA: Insufficient documentation

## 2021-06-18 DIAGNOSIS — R351 Nocturia: Secondary | ICD-10-CM

## 2021-06-18 DIAGNOSIS — G4733 Obstructive sleep apnea (adult) (pediatric): Secondary | ICD-10-CM

## 2021-06-18 DIAGNOSIS — F32A Depression, unspecified: Secondary | ICD-10-CM

## 2021-06-18 DIAGNOSIS — H9313 Tinnitus, bilateral: Secondary | ICD-10-CM

## 2021-06-18 LAB — CBC
HCT: 47.7 % (ref 39.0–52.0)
Hemoglobin: 16.1 g/dL (ref 13.0–17.0)
MCHC: 33.7 g/dL (ref 30.0–36.0)
MCV: 95.8 fl (ref 78.0–100.0)
Platelets: 200 10*3/uL (ref 150.0–400.0)
RBC: 4.98 Mil/uL (ref 4.22–5.81)
RDW: 13.7 % (ref 11.5–15.5)
WBC: 6.5 10*3/uL (ref 4.0–10.5)

## 2021-06-18 LAB — COMPREHENSIVE METABOLIC PANEL
ALT: 68 U/L — ABNORMAL HIGH (ref 0–53)
AST: 40 U/L — ABNORMAL HIGH (ref 0–37)
Albumin: 4.5 g/dL (ref 3.5–5.2)
Alkaline Phosphatase: 70 U/L (ref 39–117)
BUN: 21 mg/dL (ref 6–23)
CO2: 25 mEq/L (ref 19–32)
Calcium: 9.1 mg/dL (ref 8.4–10.5)
Chloride: 104 mEq/L (ref 96–112)
Creatinine, Ser: 0.98 mg/dL (ref 0.40–1.50)
GFR: 82.43 mL/min (ref >60.00)
Glucose, Bld: 112 mg/dL — ABNORMAL HIGH (ref 70–99)
Potassium: 4.4 mEq/L (ref 3.5–5.1)
Sodium: 138 mEq/L (ref 135–145)
Total Bilirubin: 0.7 mg/dL (ref 0.2–1.2)
Total Protein: 6.8 g/dL (ref 6.0–8.3)

## 2021-06-18 LAB — LIPID PANEL
Cholesterol: 89 mg/dL (ref 0–200)
HDL: 31 mg/dL — ABNORMAL LOW (ref >39.00)
LDL Cholesterol: 36 mg/dL (ref 0–99)
NonHDL: 58.47
Total CHOL/HDL Ratio: 3
Triglycerides: 110 mg/dL (ref 0.0–149.0)
VLDL: 22 mg/dL (ref 0.0–40.0)

## 2021-06-18 LAB — TSH: TSH: 1.39 u[IU]/mL (ref 0.35–5.50)

## 2021-06-18 LAB — MICROALBUMIN / CREATININE URINE RATIO
Creatinine,U: 95.1 mg/dL
Microalb Creat Ratio: 0.8 mg/g (ref 0.0–30.0)
Microalb, Ur: 0.8 mg/dL (ref 0.0–1.9)

## 2021-06-18 LAB — PSA: PSA: 1.28 ng/mL (ref 0.10–4.00)

## 2021-06-18 LAB — HEMOGLOBIN A1C: Hgb A1c MFr Bld: 6 % (ref 4.6–6.5)

## 2021-06-18 MED ORDER — TRULICITY 0.75 MG/0.5ML ~~LOC~~ SOAJ: 0.7500 mg | SUBCUTANEOUS | 1 refills | Status: AC

## 2021-06-18 NOTE — Assessment & Plan Note (Signed)
This is been going on since patient COVID.  Ambulatory referral to ENT placed today ears within normal luminal exam today

## 2021-06-18 NOTE — Assessment & Plan Note (Signed)
Improved since being on Cardura.  Continue taking medication as prescribed

## 2021-06-18 NOTE — Assessment & Plan Note (Signed)
Encouraged healthy lifestyle modifications inclusive of 30 minutes of exercise 5 times a week.

## 2021-06-18 NOTE — Assessment & Plan Note (Signed)
Patient currently being seen and managed by [facility] in [facility] patient currently managed on xyosted continue following with urologist as recommended take medication as prescribed

## 2021-06-18 NOTE — Progress Notes (Signed)
Established Patient Office Visit  Subjective   Patient ID: Mitchell Lewis., male    DOB: November 11, 1960  Age: 61 y.o. MRN: [id number]  Chief Complaint  Patient presents with   Annual Exam    HPI  DM2: checking 2-3 times a week. State it is around 100-120. States that trulicity is going well. States he lost a dog about 1 month ago  OSA: not interested in going to pulm  Hypogodnaism: [facility] seeing him approx every 3 months or so on Xyosted for testosterone replacement  for complete physical and follow up of chronic conditions.  Immunizations: -Tetanus:in 10 years -Influenza: out of season -Covid-19: refused -Shingles:? Thinks he has had them -Pneumonia: Pcv 20 refused  -HPV: aged out  Diet: [place].  Eating 2-3 meals coffee water and unsweet tea Exercise:  Walking daily mostly around 10-15 mins at a time.  Sometimes several times a day  Eye exam: Dr Olene Floss off of [address] in [place]. Been a few years since he has seen him.  Recommend follow-up for yearly eye exam Dental exam: Completes semi-annually    Mammogram: Completed in 11/25/2020 Colonoscopy: Completed in 08/2020 Dexa: NA PSA: Due  Lung Cancer Screening:  amb order today  Sleep: 9pm bed time and gets up at 4am. Feels rested most times. States was snoring but stopped and started back      Review of Systems  Constitutional:  Positive for fever (low grade). Negative for chills and malaise/fatigue.  Respiratory:  Positive for shortness of breath (sititng still. allergie). Negative for cough.   Cardiovascular:  Negative for chest pain and leg swelling.  Gastrointestinal:  Negative for abdominal pain, diarrhea, nausea and vomiting.       BM daily  Genitourinary:  Negative for dysuria and hematuria.       Nocturia x 1 at night  Neurological:  Positive for tingling. Negative for dizziness and headaches.  Psychiatric/Behavioral:  Negative for hallucinations and suicidal ideas.      Objective:      BP 140/74   Pulse 71   Temp 97.6 F (36.4 C)   Resp 18   Ht '5\' 8"'$  (1.727 m)   Wt 300 lb (136.1 kg)   SpO2 97%   BMI 45.61 kg/m  BP Readings from Last 3 Encounters:  06/18/21 140/74  03/08/21 (!) 144/86  03/01/21 118/80   Wt Readings from Last 3 Encounters:  06/18/21 300 lb (136.1 kg)  03/01/21 (!) 301 lb (136.5 kg)  02/15/21 299 lb 6 oz (135.8 kg)      Physical Exam Vitals and nursing note reviewed. Exam conducted with a chaperone present [facility], CMA).  Constitutional:      Appearance: Normal appearance. He is obese.  HENT:     Right Ear: Tympanic membrane, ear canal and external ear normal.     Left Ear: Tympanic membrane, ear canal and external ear normal.  Cardiovascular:     Rate and Rhythm: Normal rate and regular rhythm.     Pulses: Normal pulses.     Heart sounds: Normal heart sounds.  Pulmonary:     Effort: Pulmonary effort is normal.     Breath sounds: Normal breath sounds.  Abdominal:     General: Bowel sounds are normal. There is no distension.     Palpations: There is no mass.     Tenderness: There is no abdominal tenderness.     Hernia: No hernia is present. There is no hernia in the left  inguinal area or right inguinal area.  Genitourinary:    Penis: Normal.      Testes: Normal.     Epididymis:     Right: Normal.     Left: Normal.  Musculoskeletal:     Right lower leg: No edema.     Left lower leg: No edema.  Lymphadenopathy:     Cervical: No cervical adenopathy.     Lower Body: No right inguinal adenopathy. No left inguinal adenopathy.  Skin:    General: Skin is warm.  Neurological:     General: No focal deficit present.     Mental Status: He is alert.     Comments: Bilateral upper and lower extremity strength 5/5  Psychiatric:        Mood and Affect: Mood normal.        Behavior: Behavior normal.        Thought Content: Thought content normal.        Judgment: Judgment normal.     No results found for any visits on  06/18/21.    The ASCVD Risk score (Arnett DK, et al., 2019) failed to calculate for the following reasons:   The valid total cholesterol range is 130 to 320 mg/dL    Assessment & Plan:   Problem List Items Addressed This Visit       Respiratory   Severe obstructive sleep apnea-hypopnea syndrome    Did touch base with patient again and asked if he wanted to be seen by pulmonology patient not interested at this time refused.         Endocrine   Hypogonadism in male    Patient currently being [facility] and managed [facility] in [facility] patient currently managed on xyosted continue following with urologist as recommended take medication as prescribed       Relevant Orders   PSA   Type 2 diabetes mellitus with hyperglycemia, without long-term current use of insulin ([place])    Patient's last A1c 6.1%.  Patient currently on Trulicity 8.92 mg once weekly.  Continue medication as prescribed pending A1c in office today       Relevant Medications   Dulaglutide (TRULICITY) 1.19 ER/7.4YC SOPN   Other Relevant Orders   Microalbumin/Creatinine Ratio, Urine   Hemoglobin A1c   Lipid panel     Nervous and Auditory   Chronic midline low back pain with left-sided sciatica    Continue following with sports medicine and getting injections as recommended       Relevant Medications   cyclobenzaprine (FLEXERIL) 10 MG tablet     Other   Nocturia    Improved since being on Cardura.  Continue taking medication as prescribed       Relevant Orders   PSA   Class 3 severe obesity due to excess calories without serious comorbidity with body mass index (BMI) of 45.0 to 49.9 in adult [facility])    Encouraged healthy lifestyle modifications inclusive of 30 minutes of exercise 5 times a week.       Relevant Medications   Dulaglutide (TRULICITY) 1.44 YJ/8.5UD SOPN   Other Relevant Orders   Hemoglobin A1c   Anxiety and depression    Patient currently managed on sertraline 100 mg.  PHQ-9  and GAD-7 administered in office.  Patient denies HI/SI/AVH.  Continue medication as prescribed       Tinnitus of both ears    This is been going on since patient COVID.  Ambulatory referral to ENT placed today ears within  normal luminal exam today       Relevant Orders   Ambulatory referral to ENT   Preventative health care - Primary    Discussed age-appropriate immunizations and screening exams with patient.  Encouraged healthy lifestyle modifications       Relevant Orders   CBC   Comprehensive metabolic panel   TSH   Other Visit Diagnoses     Screening for prostate cancer       Relevant Orders   PSA   Screening for lung cancer       Relevant Orders   Ambulatory Referral for Lung Cancer Scre       Return in about 6 months (around 12/19/2021) for DM recheck.    Romilda Garret, NP

## 2021-06-18 NOTE — Patient Instructions (Signed)
Nice to see you today I will be in touch with the labs.  Follow up with me in 6 months, sooner if you need me 

## 2021-06-18 NOTE — Assessment & Plan Note (Signed)
Patient currently managed on sertraline 100 mg.  PHQ-9 and GAD-7 administered in office.  Patient denies HI/SI/AVH.  Continue medication as prescribed

## 2021-06-18 NOTE — Assessment & Plan Note (Signed)
Did touch base with patient again and asked if he wanted to be seen by pulmonology patient not interested at this time refused.

## 2021-06-18 NOTE — Assessment & Plan Note (Signed)
Discussed age-appropriate immunizations and screening exams with patient.  Encouraged healthy lifestyle modifications

## 2021-06-18 NOTE — Assessment & Plan Note (Signed)
Continue following with sports medicine and getting injections as recommended

## 2021-06-18 NOTE — Assessment & Plan Note (Signed)
Patient's last A1c 6.1%.  Patient currently on Trulicity 0.60 mg once weekly.  Continue medication as prescribed pending A1c in office today

## 2021-06-24 ENCOUNTER — Telehealth: Payer: Self-pay | Admitting: Nurse Practitioner

## 2021-06-24 DIAGNOSIS — Z8719 Personal history of other diseases of the digestive system: Secondary | ICD-10-CM

## 2021-06-24 NOTE — Telephone Encounter (Signed)
Order placed for [facility] off of [address]

## 2021-06-24 NOTE — Telephone Encounter (Signed)
-----   Message from [place] sent at 06/22/2021  3:21 PM EDT ----- Patient advised of lab results. Patient has seen GI a year ago for colonoscopy but for liver work up it has been a while, he thinks Hepatitis panel was done in the past but does not know. Patient is trying to work on his liver -holistic type of treatments, eating dragon fruit, milk thistle, dandeoline. Patient states he does get pain in the right side-under right rib area off and on for a long time. He knows that he has liver lesion from last [place] report in 2012 and that he has fatty liver. Patient wanted to know what he should do at this time?

## 2021-06-24 NOTE — Telephone Encounter (Signed)
I think obtaining an updated [place] would be the first option to make sure the liver is still looking ok. [place] or [facility]

## 2021-06-24 NOTE — Addendum Note (Signed)
Addended by: Michela Pitcher on: 06/24/2021 05:08 PM   Modules accepted: Orders

## 2021-06-24 NOTE — Telephone Encounter (Signed)
Patient advised and would like to go to [facility]. Advised patient once order is placed it will go to [place] [facility] [place] and their office will call to schedule

## 2021-07-12 ENCOUNTER — Ambulatory Visit
Admission: RE | Admit: 2021-07-12 | Discharge: 2021-07-12 | Disposition: A | Discharge status: Home / Self Care | Payer: Managed Care, Other (non HMO) | Source: Ambulatory Visit | Attending: Nurse Practitioner | Admitting: Nurse Practitioner

## 2021-07-12 DIAGNOSIS — Z8719 Personal history of other diseases of the digestive system: Secondary | ICD-10-CM

## 2021-07-14 ENCOUNTER — Encounter: Payer: Self-pay | Admitting: Nurse Practitioner

## 2021-07-14 NOTE — Telephone Encounter (Signed)
Called and left a voicemail with providers staff in regards to this. Waiting for reply

## 2021-07-14 NOTE — Telephone Encounter (Signed)
Can we call and let Dr. Erlene Quan the results of the RUQ US showing severe hepatic steatosis and see what next steps he would like to take with the patient. I have alerted the patient that we would be reaching out to Dr. Earnie Larsson

## 2021-07-19 NOTE — Telephone Encounter (Signed)
Spoke with GI office and they will need to see patient to follow up on this. LOV with them was in 2019. They will reach out to him directly. Patient was advised via mychart.

## 2021-07-23 ENCOUNTER — Ambulatory Visit: Payer: Managed Care, Other (non HMO) | Admitting: Nurse Practitioner

## 2021-07-23 VITALS — BP 140/72 | HR 68 | Temp 97.0°F | Resp 16 | Ht 68.0 in | Wt 300.3 lb

## 2021-07-23 DIAGNOSIS — K760 Fatty (change of) liver, not elsewhere classified: Secondary | ICD-10-CM | POA: Diagnosis not present

## 2021-07-23 DIAGNOSIS — G8929 Other chronic pain: Secondary | ICD-10-CM

## 2021-07-23 DIAGNOSIS — M5442 Lumbago with sciatica, left side: Secondary | ICD-10-CM

## 2021-07-23 NOTE — Assessment & Plan Note (Signed)
Has been followed by sports medicine and referred out for injections.  Patient like a second opinion to the clinic or his wife goes.  Ambulatory referral placed today

## 2021-07-29 ENCOUNTER — Ambulatory Visit: Payer: Managed Care, Other (non HMO) | Admitting: Physical Medicine and Rehabilitation

## 2021-07-29 ENCOUNTER — Encounter: Payer: Self-pay | Admitting: Physical Medicine and Rehabilitation

## 2021-07-29 VITALS — BP 137/82 | HR 91

## 2021-07-29 DIAGNOSIS — M4726 Other spondylosis with radiculopathy, lumbar region: Secondary | ICD-10-CM | POA: Diagnosis not present

## 2021-07-29 DIAGNOSIS — M47816 Spondylosis without myelopathy or radiculopathy, lumbar region: Secondary | ICD-10-CM | POA: Diagnosis not present

## 2021-07-29 DIAGNOSIS — M48062 Spinal stenosis, lumbar region with neurogenic claudication: Secondary | ICD-10-CM | POA: Diagnosis not present

## 2021-07-29 DIAGNOSIS — M5416 Radiculopathy, lumbar region: Secondary | ICD-10-CM | POA: Diagnosis not present

## 2021-07-29 NOTE — Progress Notes (Signed)
Greene Diodato. - 61 y.o. male MRN [id number]  Date of birth: 18-Jul-1960  Office Visit Note: Visit Date: 07/29/2021 PCP: Michela Pitcher, NP Referred by: Michela Pitcher, NP  Subjective: Chief Complaint  Patient presents with   Lower Back - Pain   Right Leg - Pain   Left Leg - Pain   HPI: Bird Swetz. is a 61 y.o. male who comes in today at the request of Karl Ito, NP for evaluation of chronic, worsening and severe bilateral lower back pain radiating down both legs, left greater than right. Patient reports pain has been ongoing for several months and is exacerbated by prolonged standing and walking.  He describes his pain as a sore, aching and numbness sensation, currently rates as 7 out of 10.  Patient reports some relief of pain with home exercise regimen, rest, use of heat/ice and medications. Patient has tried Flexeril and Meloxicam in the past and reports minimal relief of pain with these medications.  Patient states he does take frequent breaks when traveling or working which seems to help alleviate his pain some. Patient's lumbar MRI imaging from 2022 exhibits multilevel facet arthrosis, more prominent in the lower lumbar spine. There is a grade 1 listhesis at L4-L5 resulting in moderate spinal canal stenosis and left greater than right lateral recess stenosis. Potential impingement of the traversing L5 nerve roots within the lateral recesses, particularly on the left.  No high grade spinal canal stenosis noted. Patient was previously treated by Dr. Glennon Mac at [place] and was sent for bilateral L4-L5 facet joint injections at [facility], reports good relief with right sided facet injection, states left sided facet injection was very painful and provided minimal relief of pain. Dr. Glennon Mac did refer patient to neurosurgeon at [facility], however patient is unsure if he actually talked with surgeon. I am not able to locate surgical  consultation notes. Patent states severe pain is negatively impacting his daily life. Patient denies focal weakness. Patient denies recent trauma or falls.    Review of Systems  Musculoskeletal:  Positive for back pain.  Neurological:  Positive for tingling. Negative for sensory change, focal weakness and weakness.  All other systems reviewed and are negative.  Otherwise per HPI.  Assessment & Plan: Visit Diagnoses:    ICD-10-CM   1. Lumbar radiculopathy  M54.16     2. Other spondylosis with radiculopathy, lumbar region  M47.26     3. Spinal stenosis of lumbar region with neurogenic claudication  M48.062     4. Facet arthropathy, lumbar  M47.816        Plan: Findings:  Chronic, worsening and severe bilateral lower back pain radiating down both legs.  Patient continues to have severe pain despite good conservative therapy such as home exercise regimen, rest, use of heat/ice and medications.  We did discuss patient's lumbar MRI from 2022 using imaging and spine model. Patient's clinical presentation and exam are consistent with neurogenic claudication as a result of multifactorial stenosis. There is moderate spinal canal stenosis and bilateral lateral recess stenosis at the level of L4-L5 on lumbar MRI imaging.  We believe the next step is to perform a diagnostic and hopefully therapeutic bilateral L4 transforaminal epidural steroid injection under fluoroscopic guidance.  If patient gets significant and lasting pain relief with epidural steroid injection we did discuss the possibility of repeating this procedure infrequently.  If patient's lower back pain persists post epidural steroid injection  we also discussed the possibility of performing facet joint injections.  I do feel patient would be a good candidate for radiofrequency ablation in the future if he gets good relief with diagnostic facet joint injections.  I did discuss lumbar epidural steroid injection procedure with patient, he has no  questions at this time. No red flag symptoms noted upon exam today.     Meds & Orders: No orders of the defined types were placed in this encounter.  No orders of the defined types were placed in this encounter.   Follow-up: Return for Bilateral L4 transforaminal epidural steroid injection.   Procedures: No procedures performed      Clinical History: INDICATION: Lumbago with sciatica, left side   COMPARISON: None.   TECHNIQUE: MRI SPINE LUMBAR WO IV CONTRAST.   FINDINGS:  #  Osseous structures: Vertebral body heights are maintained. No acute fracture. No pars defect. No destructive bony changes or concerning marrow signal abnormality.  #  Alignment:Grade 1 anterolisthesis L4-5.  #  Conus medullaris/cauda equina: Normal. Conus terminates at T12-L1.   #  Lower thoracic spine: No significant spinal canal or foraminal stenosis.   #  T12-L1: Mild DDD. Preservation of disc height. No focal disc herniation or significant stenosis.  #  L1-L2: Mild DDD. Preservation of disc height. No focal disc herniation or significant stenosis.  #  L2-L3: Mild DDD and facet arthrosis. No preservation of disc height. No focal disc herniation or significant stenosis.  #  L3-L4: Mild DDD and facet arthrosis. Preservation of disc height. No focal disc herniation or significant stenosis.  #  L4-L5: Mild DDD and moderate to severe facet arthrosis. Mild loss of disc height. Grade 1 anterolisthesis. Mild disc bulge and marginal spurring. Posterior hypertrophic changes. Moderate spinal canal and left greater than right lateral recess stenosis. Potential impingement of the traversing L5 nerve roots within the lateral recesses, particularly on the left. Moderate bilateral foraminal stenosis, right greater than left.  #  L5-S1: Mild DDD and moderate facet arthrosis. Preservation of disc height. Mild generalized disc osteophyte complex. No focal disc herniation or significant spinal canal stenosis. Mild bilateral  foraminal stenosis.   #  Paraspinal tissues: Unremarkable   #  Contrast: None given.   #  Additional comments: None.  IMPRESSION:  1.  Multilevel DDD and facet arthrosis, more prominent lower lumbar spine. At L4-5, there is grade 1 degenerative spinal listhesis and prominent posterior hypertrophic changes resulting in moderate spinal canal and left greater than right lateral recess stenosis. Potential impingement of the traversing L5 nerve roots within the lateral recesses, particularly on the left. There is also moderate right greater than left foraminal stenosis at this level. Mild bilateral foraminal stenosis L5-S1.  2.  No acute fracture.   Electronically Signed by: Eddie Dibbles on 11/01/2020 9:10 AM   He reports that he quit smoking about 8 years ago. His smoking use included cigarettes. He has a 40.00 pack-year smoking history. He has never used smokeless tobacco.  Recent Labs    11/05/20 1234 02/15/21 0815 06/18/21 0845  HGBA1C 6.9* 6.1* 6.0    Objective:  VS:  HT:    WT:   BMI:     BP:137/82  HR:91bpm  TEMP: ( )  RESP:  Physical Exam Vitals and nursing note reviewed.  HENT:     Head: Normocephalic and atraumatic.     Right Ear: External ear normal.     Left Ear: External ear normal.     Nose: Nose  normal.     Mouth/Throat:     Mouth: Mucous membranes are moist.  Eyes:     Pupils: Pupils are equal, round, and reactive to light.  Cardiovascular:     Rate and Rhythm: Normal rate.     Pulses: Normal pulses.  Pulmonary:     Effort: Pulmonary effort is normal.  Abdominal:     General: Abdomen is flat. There is no distension.  Musculoskeletal:        General: Tenderness present.     Cervical back: Normal range of motion.     Comments: Pt rises from seated position to standing without difficulty. Good lumbar range of motion. Strong distal strength without clonus, no pain upon palpation of greater trochanters. Sensation intact bilaterally. Walks independently, gait  steady.   Skin:    General: Skin is warm and dry.     Capillary Refill: Capillary refill takes less than 2 seconds.  Neurological:     General: No focal deficit present.     Mental Status: He is alert and oriented to person, place, and time.  Psychiatric:        Mood and Affect: Mood normal.        Behavior: Behavior normal.     Ortho Exam  Imaging: No results found.  Past Medical/Family/Surgical/Social History: Medications & Allergies reviewed per EMR, new medications updated. Patient Active Problem List   Diagnosis Date Noted   Hepatic steatosis 07/23/2021   Other seborrheic keratosis 06/18/2021   History of malignant neoplasm of skin 06/18/2021   Melanocytic nevi of trunk 06/18/2021   Tinnitus of both ears 06/18/2021   Preventative health care 06/18/2021   Type 2 diabetes mellitus with hyperglycemia, without long-term current use of insulin ([place]) 12/01/2020   Gynecomastia 12/01/2020   Elevated LFTs 12/01/2020   Callus of foot 12/01/2020   Anxiety and depression 12/30/2019   DOE (dyspnea on exertion)    Abnormal stress test    Elevated lipids 11/08/2019   Chronic midline low back pain with left-sided sciatica 09/15/2017   Class 3 severe obesity due to excess calories without serious comorbidity with body mass index (BMI) of 45.0 to 49.9 in adult ([place]) 09/15/2017   Severe obstructive sleep apnea-hypopnea syndrome 09/15/2017   Hypogonadism in male 03/27/2017   Nocturia 03/27/2017   Past Medical History:  Diagnosis Date   Colon polyp    Fatty liver    History of colon polyps 2018   Prediabetes 09/15/2017   Sleep apnea    Subareolar mass of left breast 11/10/2020   Family History  Problem Relation Age of Onset   Breast cancer Mother 42   Cancer Mother 23       Breast cancer with radition   Diabetes Mother    Cancer Father        pancreatic   Heart disease Father    Kidney failure Father    Cancer Paternal Grandmother        prostate   Prostate cancer  Paternal Grandfather    Past Surgical History:  Procedure Laterality Date   COLONOSCOPY  04/27/2020   RIGHT/LEFT HEART CATH AND CORONARY ANGIOGRAPHY Bilateral 12/24/2019   Procedure: RIGHT/LEFT HEART CATH AND CORONARY ANGIOGRAPHY;  Surgeon: Nelva Bush, MD;  Location: [facility];  Service: Cardiovascular;  Laterality: Bilateral;   TMJ ARTHROSCOPY  1993   VASECTOMY     WISDOM TOOTH EXTRACTION  03/17/2017   Social History   Occupational History   Occupation: full time Super  Tobacco  Use   Smoking status: Former    Packs/day: 1.00    Years: 40.00    Total pack years: 40.00    Types: Cigarettes    Quit date: 05/15/2013    Years since quitting: 8.2   Smokeless tobacco: Never  Vaping Use   Vaping Use: Never used  Substance and Sexual Activity   Alcohol use: Yes    Alcohol/week: 1.0 standard drink of alcohol    Types: 1 Standard drinks or equivalent per week   Drug use: No   Sexual activity: Not on file

## 2021-07-29 NOTE — Progress Notes (Signed)
Pt state lower back pain that travels down both legs. Pt state sitting, standing and walking. Pt state he takes pain meds to help ease his pain.  Numeric Pain Rating Scale and Functional Assessment Average Pain 10 Pain Right Now 5 My pain is constant, burning, dull, tingling, and aching Pain is worse with: walking, bending, sitting, standing, some activites, and laying down Pain improves with: rest, heat/ice, medication, and injections   In the last MONTH (on 0-10 scale) has pain interfered with the following?  1. General activity like being  able to carry out your everyday physical activities such as walking, climbing stairs, carrying groceries, or moving a chair?  Rating(5)  2. Relation with others like being able to carry out your usual social activities and roles such as  activities at home, at work and in your community. Rating(6)  3. Enjoyment of life such that you have  been bothered by emotional problems such as feeling anxious, depressed or irritable?  Rating(7)

## 2021-08-13 ENCOUNTER — Telehealth: Payer: Self-pay | Admitting: Physical Medicine and Rehabilitation

## 2021-08-13 ENCOUNTER — Telehealth: Payer: Self-pay | Admitting: Physician Assistant

## 2021-08-13 NOTE — Telephone Encounter (Signed)
Error

## 2021-08-13 NOTE — Telephone Encounter (Signed)
Mitchell Lewis is requesting pain medication to help him manage his back pain until his injection on 8/2 with [facility]. Please advise

## 2021-08-16 ENCOUNTER — Other Ambulatory Visit: Payer: Self-pay | Admitting: Physical Medicine and Rehabilitation

## 2021-08-16 ENCOUNTER — Other Ambulatory Visit: Payer: Self-pay

## 2021-08-16 DIAGNOSIS — Z87891 Personal history of nicotine dependence: Secondary | ICD-10-CM

## 2021-08-16 DIAGNOSIS — Z122 Encounter for screening for malignant neoplasm of respiratory organs: Secondary | ICD-10-CM

## 2021-08-16 MED ORDER — TRAMADOL HCL 50 MG PO TABS: 50.0000 mg | ORAL_TABLET | Freq: Three times a day (TID) | ORAL | 0 refills | Status: DC | PRN

## 2021-08-26 ENCOUNTER — Other Ambulatory Visit: Payer: Self-pay | Admitting: Nurse Practitioner

## 2021-08-26 DIAGNOSIS — R351 Nocturia: Secondary | ICD-10-CM

## 2021-09-01 ENCOUNTER — Encounter: Payer: Self-pay | Admitting: Physical Medicine and Rehabilitation

## 2021-09-01 ENCOUNTER — Ambulatory Visit: Payer: Self-pay

## 2021-09-01 ENCOUNTER — Ambulatory Visit: Payer: Managed Care, Other (non HMO) | Admitting: Physical Medicine and Rehabilitation

## 2021-09-01 ENCOUNTER — Encounter: Payer: Self-pay | Admitting: Acute Care

## 2021-09-01 ENCOUNTER — Ambulatory Visit: Payer: Managed Care, Other (non HMO) | Admitting: Acute Care

## 2021-09-01 VITALS — BP 142/74 | HR 80 | Ht 68.0 in | Wt 300.0 lb

## 2021-09-01 DIAGNOSIS — M5416 Radiculopathy, lumbar region: Secondary | ICD-10-CM | POA: Diagnosis not present

## 2021-09-01 DIAGNOSIS — Z87891 Personal history of nicotine dependence: Secondary | ICD-10-CM | POA: Diagnosis not present

## 2021-09-01 DIAGNOSIS — Z7189 Other specified counseling: Secondary | ICD-10-CM | POA: Diagnosis not present

## 2021-09-01 MED ORDER — METHYLPREDNISOLONE ACETATE 80 MG/ML IJ SUSP
80.0000 mg | Freq: Once | INTRAMUSCULAR | Status: AC
  Administered 2021-09-01: 80 mg

## 2021-09-01 NOTE — Progress Notes (Signed)
Mitchell Lewis. - 61 y.o. male MRN [id number]  Date of birth: [birth year]  Office Visit Note: Visit Date: 09/01/2021 PCP: Michela Pitcher, NP Referred by: Michela Pitcher, NP  Subjective: Chief Complaint  Patient presents with   Lower Back - Pain   HPI:  [name]. is a 61 y.o. male who comes in today at the request of Barnet Pall, FNP for planned Bilateral L4-5 Lumbar Transforaminal epidural steroid injection with fluoroscopic guidance.  The patient has failed conservative care including home exercise, medications, time and activity modification.  This injection will be diagnostic and hopefully therapeutic.  Please see requesting physician notes for further details and justification.   ROS Otherwise per HPI.  Assessment & Plan: Visit Diagnoses:    ICD-10-CM   1. Lumbar radiculopathy  M54.16 XR C-ARM NO REPORT    Epidural Steroid injection    methylPREDNISolone acetate (DEPO-MEDROL) injection 80 mg      Plan: No additional findings.   Meds & Orders:  Meds ordered this encounter  Medications   methylPREDNISolone acetate (DEPO-MEDROL) injection 80 mg    Orders Placed This Encounter  Procedures   XR C-ARM NO REPORT   Epidural Steroid injection    Follow-up: No follow-ups on file.   Procedures: No procedures performed  Lumbosacral Transforaminal Epidural Steroid Injection - Sub-Pedicular Approach with Fluoroscopic Guidance  Patient: Mitchell Lewis.      Date of Birth: 07/17/1960 MRN: [id number] PCP: Michela Pitcher, NP      Visit Date: 09/01/2021   Universal Protocol:    Date/Time: 09/01/2021  Consent Given By: the patient  Position: PRONE  Additional Comments: Vital signs were monitored before and after the procedure. Patient was prepped and draped in the usual sterile fashion. The correct patient, procedure, and site was verified.   Injection Procedure Details:   Procedure diagnoses: Lumbar radiculopathy [M54.16]    Meds Administered:   Meds ordered this encounter  Medications   methylPREDNISolone acetate (DEPO-MEDROL) injection 80 mg    Laterality: Bilateral  Location/Site: L4  Needle:5.0 in., 22 ga.  Short bevel or Quincke spinal needle  Needle Placement: Transforaminal  Findings:    -Comments: Excellent flow of contrast along the nerve, nerve root and into the epidural space.  Procedure Details: After squaring off the end-plates to get a true AP view, the C-arm was positioned so that an oblique view of the foramen as noted above was visualized. The target area is just inferior to the "nose of the scotty dog" or sub pedicular. The soft tissues overlying this structure were infiltrated with 2-3 ml. of 1% Lidocaine without Epinephrine.  The spinal needle was inserted toward the target using a "trajectory" view along the fluoroscope beam.  Under AP and lateral visualization, the needle was advanced so it did not puncture dura and was located close the 6 O'Clock position of the pedical in AP tracterory. Biplanar projections were used to confirm position. Aspiration was confirmed to be negative for CSF and/or blood. A 1-2 ml. volume of Isovue-250 was injected and flow of contrast was noted at each level. Radiographs were obtained for documentation purposes.   After attaining the desired flow of contrast documented above, a 0.5 to 1.0 ml test dose of 0.25% Marcaine was injected into each respective transforaminal space.  The patient was observed for 90 seconds post injection.  After no sensory deficits were reported, and normal lower extremity motor function was noted,   the above injectate was  administered so that equal amounts of the injectate were placed at each foramen (level) into the transforaminal epidural space.   Additional Comments:  The patient tolerated the procedure well Dressing: 2 x 2 sterile gauze and Band-Aid    Post-procedure details: Patient was observed during the procedure. Post-procedure  instructions were reviewed.  Patient left the clinic in stable condition.    Clinical History: INDICATION: Lumbago with sciatica, left side   COMPARISON: None.   TECHNIQUE: MRI SPINE LUMBAR WO IV CONTRAST.   FINDINGS:  #  Osseous structures: Vertebral body heights are maintained. No acute fracture. No pars defect. No destructive bony changes or concerning marrow signal abnormality.  #  Alignment:Grade 1 anterolisthesis L4-5.  #  Conus medullaris/cauda equina: Normal. Conus terminates at T12-L1.   #  Lower thoracic spine: No significant spinal canal or foraminal stenosis.   #  T12-L1: Mild DDD. Preservation of disc height. No focal disc herniation or significant stenosis.  #  L1-L2: Mild DDD. Preservation of disc height. No focal disc herniation or significant stenosis.  #  L2-L3: Mild DDD and facet arthrosis. No preservation of disc height. No focal disc herniation or significant stenosis.  #  L3-L4: Mild DDD and facet arthrosis. Preservation of disc height. No focal disc herniation or significant stenosis.  #  L4-L5: Mild DDD and moderate to severe facet arthrosis. Mild loss of disc height. Grade 1 anterolisthesis. Mild disc bulge and marginal spurring. Posterior hypertrophic changes. Moderate spinal canal and left greater than right lateral recess stenosis. Potential impingement of the traversing L5 nerve roots within the lateral recesses, particularly on the left. Moderate bilateral foraminal stenosis, right greater than left.  #  L5-S1: Mild DDD and moderate facet arthrosis. Preservation of disc height. Mild generalized disc osteophyte complex. No focal disc herniation or significant spinal canal stenosis. Mild bilateral foraminal stenosis.   #  Paraspinal tissues: Unremarkable   #  Contrast: None given.   #  Additional comments: None.  IMPRESSION:  1.  Multilevel DDD and facet arthrosis, more prominent lower lumbar spine. At L4-5, there is grade 1 degenerative spinal listhesis  and prominent posterior hypertrophic changes resulting in moderate spinal canal and left greater than right lateral recess stenosis. Potential impingement of the traversing L5 nerve roots within the lateral recesses, particularly on the left. There is also moderate right greater than left foraminal stenosis at this level. Mild bilateral foraminal stenosis L5-S1.  2.  No acute fracture.   Electronically Signed by: Eddie Dibbles on 11/01/2020 9:10 AM     Objective:  VS:  HT:'5\' 8"'$  (172.7 cm)   WT:300 lb (136.1 kg)  BMI:45.63    BP:(!) 142/74  HR:80bpm  TEMP: ( )  RESP:  Physical Exam Vitals and nursing note reviewed.  Constitutional:      General: He is not in acute distress.    Appearance: Normal appearance. He is not ill-appearing.  HENT:     Head: Normocephalic and atraumatic.     Right Ear: External ear normal.     Left Ear: External ear normal.     Nose: No congestion.  Eyes:     Extraocular Movements: Extraocular movements intact.  Cardiovascular:     Rate and Rhythm: Normal rate.     Pulses: Normal pulses.  Pulmonary:     Effort: Pulmonary effort is normal. No respiratory distress.  Abdominal:     General: There is no distension.     Palpations: Abdomen is soft.  Musculoskeletal:  General: No tenderness or signs of injury.     Cervical back: Neck supple.     Right lower leg: No edema.     Left lower leg: No edema.     Comments: Patient has good distal strength without clonus.  Skin:    Findings: No erythema or rash.  Neurological:     General: No focal deficit present.     Mental Status: He is alert and oriented to person, place, and time.     Sensory: No sensory deficit.     Motor: No weakness or abnormal muscle tone.     Coordination: Coordination normal.  Psychiatric:        Mood and Affect: Mood normal.        Behavior: Behavior normal.      Imaging: No results found.

## 2021-09-01 NOTE — Progress Notes (Signed)
Numeric Pain Rating Scale and Functional Assessment Average Pain 8  LBP with pain in left buttock. In the last MONTH (on 0-10 scale) has pain interfered with the following?  1. General activity like being  able to carry out your everyday physical activities such as walking, climbing stairs, carrying groceries, or moving a chair?  Rating(8)   +Driver, -BT, -Dye Allergies.

## 2021-09-01 NOTE — Progress Notes (Signed)
Virtual Visit via Telephone Note  I connected with Mitchell Lewis. on 09/01/21 at 12:00 PM EDT by telephone and verified that I am speaking with the correct person using two identifiers.  Location: Patient:  At home Provider: [address], [place], [place], [address]    I discussed the limitations, risks, security and privacy concerns of performing an evaluation and management service by telephone and the availability of in person appointments. I also discussed with the patient that there may be a patient responsible charge related to this service. The patient expressed understanding and agreed to proceed.   Shared Decision Making Visit Lung Cancer Screening Program [phone])   Eligibility: Age 61 y.o. Pack Years Smoking History Calculation 39 pack year smoking history (# packs/per year x # years smoked) Recent History of coughing up blood  no Unexplained weight loss? no ( >Than 15 pounds within the last 6 months ) Prior History Lung / other cancer no (Diagnosis within the last 5 years already requiring surveillance chest CT Scans). Smoking Status Former Smoker Former Smokers: Years since quit: 8 years  Quit Date: 2015  Visit Components: Discussion included one or more decision making aids. yes Discussion included risk/benefits of screening. yes Discussion included potential follow up diagnostic testing for abnormal scans. yes Discussion included meaning and risk of over diagnosis. yes Discussion included meaning and risk of False Positives. yes Discussion included meaning of total radiation exposure. yes  Counseling Included: Importance of adherence to annual lung cancer LDCT screening. yes Impact of comorbidities on ability to participate in the program. yes Ability and willingness to under diagnostic treatment. yes  Smoking Cessation Counseling: Current Smokers:  Discussed importance of smoking cessation. yes Information about tobacco cessation classes and  interventions provided to patient. yes Patient provided with "ticket" for LDCT Scan. yes Symptomatic Patient. no  Counseling NA Diagnosis Code: Tobacco Use Z72.0 Asymptomatic Patient yes  Counseling (Intermediate counseling: > three minutes counseling) D5329 Former Smokers:  Discussed the importance of maintaining cigarette abstinence. yes Diagnosis Code: Personal History of Nicotine Dependence. J24.268 Information about tobacco cessation classes and interventions provided to patient. Yes Patient provided with "ticket" for LDCT Scan. yes Written Order for Lung Cancer Screening with LDCT placed in Epic. Yes (CT Chest Lung Cancer Screening Low Dose W/O CM) [id number] Z12.2-Screening of respiratory organs Z87.891-Personal history of nicotine dependence  I spent 25 minutes of face to face time/virtual visit time  with  Mitchell Lewis discussing the risks and benefits of lung cancer screening. We took the time to pause the power point at intervals to allow for questions to be asked and answered to ensure understanding. We discussed that he had taken the single most powerful action possible to decrease his risk of developing lung cancer when he quit smoking. I counseled him to remain smoke free, and to contact me if he ever had the desire to smoke again so that I can provide resources and tools to help support the effort to remain smoke free. We discussed the time and location of the scan, and that either  Doroteo Glassman RN, Joella Prince, RN or I  or I will call / send a letter with the results within  24-72 hours of receiving them. He has the office contact information in the event he needs to speak with me,  he verbalized understanding of all of the above and had no further questions upon leaving the office.     I explained to the patient that there has  been a high incidence of coronary artery disease noted on these exams. I explained that this is a non-gated exam therefore degree or severity cannot be  determined. This patient is on statin therapy. I have asked the patient to follow-up with their PCP regarding any incidental finding of coronary artery disease and management with diet or medication as they feel is clinically indicated. The patient verbalized understanding of the above and had no further questions.     Magdalen Spatz, NP 09/01/2021

## 2021-09-01 NOTE — Patient Instructions (Signed)
Thank you for participating in the [facility] Screening [facility]. It was our pleasure to meet you today. We will call you with the results of your scan within the next few days. Your scan will be assigned a Lung RADS category score by the physicians reading the scans.  This Lung RADS score determines follow up scanning.  See below for description of categories, and follow up screening recommendations. We will be in touch to schedule your follow up screening annually or based on recommendations of our providers. We will fax a copy of your scan results to your Primary Care Physician, or the physician who referred you to the program, to ensure they have the results. Please call the office if you have any questions or concerns regarding your scanning experience or results.  Our office number is [phone]. Please speak with Denise Phelps, RN. , or  Denise Buckner RN, They are  our Lung Cancer Screening RN.'s If They are unavailable when you call, Please leave a message on the voice mail. We will return your call at our earliest convenience.This voice mail is monitored several times a day.  Remember, if your scan is normal, we will scan you annually as long as you continue to meet the criteria for the program. (Age 55-77, Current smoker or smoker who has quit within the last 15 years). If you are a smoker, remember, quitting is the single most powerful action that you can take to decrease your risk of lung cancer and other pulmonary, breathing related problems. We know quitting is hard, and we are here to help.  Please let us know if there is anything we can do to help you meet your goal of quitting. If you are a former smoker, congratulations. We are proud of you! Remain smoke free! Remember you can refer friends or family members through the number above.  We will screen them to make sure they meet criteria for the program. Thank you for helping us take better care of you by  participating in Lung Screening.  You can receive free nicotine replacement therapy ( patches, gum or mints) by calling 1-800-QUIT NOW. Please call so we can get you on the path to becoming  a non-smoker. I know it is hard, but you can do this!  Lung RADS Categories:  Lung RADS 1: no nodules or definitely non-concerning nodules.  Recommendation is for a repeat annual scan in 12 months.  Lung RADS 2:  nodules that are non-concerning in appearance and behavior with a very low likelihood of becoming an active cancer. Recommendation is for a repeat annual scan in 12 months.  Lung RADS 3: nodules that are probably non-concerning , includes nodules with a low likelihood of becoming an active cancer.  Recommendation is for a 6-month repeat screening scan. Often noted after an upper respiratory illness. We will be in touch to make sure you have no questions, and to schedule your 6-month scan.  Lung RADS 4 A: nodules with concerning findings, recommendation is most often for a follow up scan in 3 months or additional testing based on our provider's assessment of the scan. We will be in touch to make sure you have no questions and to schedule the recommended 3 month follow up scan.  Lung RADS 4 B:  indicates findings that are concerning. We will be in touch with you to schedule additional diagnostic testing based on our provider's  assessment of the scan.  Other options for assistance in smoking cessation (   As covered by your insurance benefits)  Hypnosis for smoking cessation  [employer] [phone]  Acupuncture for smoking cessation  [facility] [phone]   

## 2021-09-01 NOTE — Patient Instructions (Signed)

## 2021-09-02 ENCOUNTER — Ambulatory Visit
Admission: RE | Admit: 2021-09-02 | Discharge: 2021-09-02 | Disposition: A | Discharge status: Home / Self Care | Payer: Managed Care, Other (non HMO) | Source: Ambulatory Visit | Attending: Acute Care | Admitting: Acute Care

## 2021-09-02 DIAGNOSIS — Z122 Encounter for screening for malignant neoplasm of respiratory organs: Secondary | ICD-10-CM

## 2021-09-02 DIAGNOSIS — Z87891 Personal history of nicotine dependence: Secondary | ICD-10-CM

## 2021-09-08 ENCOUNTER — Other Ambulatory Visit: Payer: Self-pay | Admitting: Physical Medicine and Rehabilitation

## 2021-09-09 ENCOUNTER — Telehealth: Payer: Self-pay | Admitting: Acute Care

## 2021-09-09 DIAGNOSIS — R0609 Other forms of dyspnea: Secondary | ICD-10-CM

## 2021-09-09 DIAGNOSIS — R911 Solitary pulmonary nodule: Secondary | ICD-10-CM

## 2021-09-09 DIAGNOSIS — Z87891 Personal history of nicotine dependence: Secondary | ICD-10-CM

## 2021-09-09 NOTE — Telephone Encounter (Signed)
I have called the patient with the results of the low dose Ct Chest. I explained that his scan was read as a Lung  RADS 3, nodules that are probably benign findings, short term follow up suggested: includes nodules with a low likelihood of becoming a clinically active cancer. Radiology recommends a 6 month repeat LDCT follow up. He has few scattered solid pulmonary nodules in both lungs, largest 7.9 mm in volume derived mean diameter in the peripheral right middle lobe that need a 6 month follow up scan. He verbalized understanding and is in agreement with a 6 month follow up.   He has moderate emphysema on his CT. He endorses shortness of breath. He is not on any pulmonary inhalers and has never had PFT's or Spirometry. I told him I felt he would benefit from Pulmonary medicine. I offered a referral to Pulmonary. He is in agreement with this and I will place the referral today.   Dr. Erin Fulling, I have referred him to you.Super nice guy  He also has severe sleep apnea. Looks like that is managed by Ashby Dawes when he was part of [facility]. He may also need follow up on this.   I have informed him that there is notation of 3 vessel CAD. He is on statin therapy. He had a cath in 2021, but I have asked him to follow up with cardiology. He was last seen by Dr. Garen Lah in [place]. He said he will follow up.     I will place referral today for pulmonary management of shortness of breath. He will need PFT's to evaluate for need for maintenance/ rescue   inhalers if clinically appropriate  Langley Gauss, please place order for 6 month follow up low dose Ct Chest, and fax results to PCP. Thanks so much

## 2021-09-09 NOTE — Telephone Encounter (Signed)
Order placed for 6 month nodule follow up CT chest and results/plan faxed to PCP

## 2021-09-12 NOTE — Procedures (Signed)
Lumbosacral Transforaminal Epidural Steroid Injection - Sub-Pedicular Approach with Fluoroscopic Guidance  Patient: Mitchell Lewis.      Date of Birth: 01-19-1961 MRN: [id number] PCP: Michela Pitcher, NP      Visit Date: 09/01/2021   Universal Protocol:    Date/Time: 09/01/2021  Consent Given By: the patient  Position: PRONE  Additional Comments: Vital signs were monitored before and after the procedure. Patient was prepped and draped in the usual sterile fashion. The correct patient, procedure, and site was verified.   Injection Procedure Details:   Procedure diagnoses: Lumbar radiculopathy [M54.16]    Meds Administered:  Meds ordered this encounter  Medications   methylPREDNISolone acetate (DEPO-MEDROL) injection 80 mg    Laterality: Bilateral  Location/Site: L4  Needle:5.0 in., 22 ga.  Short bevel or Quincke spinal needle  Needle Placement: Transforaminal  Findings:    -Comments: Excellent flow of contrast along the nerve, nerve root and into the epidural space.  Procedure Details: After squaring off the end-plates to get a true AP view, the C-arm was positioned so that an oblique view of the foramen as noted above was visualized. The target area is just inferior to the "nose of the scotty dog" or sub pedicular. The soft tissues overlying this structure were infiltrated with 2-3 ml. of 1% Lidocaine without Epinephrine.  The spinal needle was inserted toward the target using a "trajectory" view along the fluoroscope beam.  Under AP and lateral visualization, the needle was advanced so it did not puncture dura and was located close the 6 O'Clock position of the pedical in AP tracterory. Biplanar projections were used to confirm position. Aspiration was confirmed to be negative for CSF and/or blood. A 1-2 ml. volume of Isovue-250 was injected and flow of contrast was noted at each level. Radiographs were obtained for documentation purposes.   After attaining the  desired flow of contrast documented above, a 0.5 to 1.0 ml test dose of 0.25% Marcaine was injected into each respective transforaminal space.  The patient was observed for 90 seconds post injection.  After no sensory deficits were reported, and normal lower extremity motor function was noted,   the above injectate was administered so that equal amounts of the injectate were placed at each foramen (level) into the transforaminal epidural space.   Additional Comments:  The patient tolerated the procedure well Dressing: 2 x 2 sterile gauze and Band-Aid    Post-procedure details: Patient was observed during the procedure. Post-procedure instructions were reviewed.  Patient left the clinic in stable condition.

## 2021-09-15 ENCOUNTER — Telehealth: Payer: Self-pay | Admitting: Acute Care

## 2021-09-15 NOTE — Telephone Encounter (Signed)
I have attempted to call the patient with the results of their  Low Dose CT Chest Lung cancer screening scan. There was no answer. There was no option to leave a message as the voice mail box is full. We will continue to call until patient is contacted.  He may need a letter. Thanks

## 2021-09-16 NOTE — Telephone Encounter (Signed)
I have called the patient with the results of his low dose CT Chest. I explained that his scan was read as a Lung  RADS 3, nodules that are probably benign findings, short term follow up suggested: includes nodules with a low likelihood of becoming a clinically active cancer. Radiology recommends a 6 month repeat LDCT follow up. He is in agreement with this plan.   Denise,please place order for 6 month low dose CT /chest follow up, and fax results to PCP with plan for 6 month follow up. . Thanks so much  Pt has an appointment with Dr. Melvyn Novas this coming week. Dr. Melvyn Novas I wanted you to know we have reviewed the scan with Mr. Robar, and we have ordered the follow up. Thanks.

## 2021-09-16 NOTE — Telephone Encounter (Signed)
Order placed for 6 month LCS CT for nodule follow up. Results to PCP with  plan.

## 2021-09-29 ENCOUNTER — Encounter: Payer: Self-pay | Admitting: Pulmonary Disease

## 2021-09-29 ENCOUNTER — Ambulatory Visit: Payer: Managed Care, Other (non HMO) | Admitting: Pulmonary Disease

## 2021-09-29 VITALS — BP 124/72 | HR 80 | Ht 68.0 in | Wt 299.8 lb

## 2021-09-29 DIAGNOSIS — G4733 Obstructive sleep apnea (adult) (pediatric): Secondary | ICD-10-CM | POA: Diagnosis not present

## 2021-09-29 DIAGNOSIS — R918 Other nonspecific abnormal finding of lung field: Secondary | ICD-10-CM

## 2021-09-29 DIAGNOSIS — J432 Centrilobular emphysema: Secondary | ICD-10-CM | POA: Diagnosis not present

## 2021-09-29 MED ORDER — TRELEGY ELLIPTA 100-62.5-25 MCG/ACT IN AEPB: 1.0000 | INHALATION_SPRAY | Freq: Every day | RESPIRATORY_TRACT | 0 refills | Status: DC

## 2021-09-29 NOTE — Patient Instructions (Addendum)
Start trelegy ellipta 1 puff daily - rinse mouth out after each use  If you notice benefit, please message [place] if you notice improvement and we will send in a prescription  We will schedule you for a split night sleep study for your sleep apnea  Follow up in 2 months for pulmonary function tests

## 2021-09-29 NOTE — Progress Notes (Signed)
Synopsis: Referred in August 2023 for shortness of breath by Eric Form, NP  Subjective:   PATIENT ID: Mitchell Lewis. GENDER: male DOB: 12-Aug-1960, MRN: [id number]  HPI  Chief Complaint  Patient presents with   Consult    Pt consult for DOE/SOB, says his symptoms are random and nothing make the breathing better. Pt wife does also state he stops breathing in his sleep and snore as well.   Mitchell Lewis is a 61 year old male, former smoker with obstructive sleep apnea who is referred to pulmonary clinic for dyspnea.   He is referred from the lung cancer screening team for shortness of breath. He reports having shortness of breath over recent months with rare cough and some wheezing noted by hiw wife. He reports progressive weight gain over the past 3 years. He does report waking up from sleep with shortness of breath. His wife reports apneic events and gasping events in his sleep. He will sometimes sleep in a recliner which helps him sleep.   He quit smoking in 2015. He has a 60 pack year history as he smoked 1.5 packs for 40 years. He denies family history of lung disease or lung cancer.   Past Medical History:  Diagnosis Date   Colon polyp    Fatty liver    History of colon polyps 2018   Prediabetes 09/15/2017   Sleep apnea    Subareolar mass of left breast 11/10/2020     Family History  Problem Relation Age of Onset   Breast cancer Mother 12   Cancer Mother 54       Breast cancer with radition   Diabetes Mother    Cancer Father        pancreatic   Heart disease Father    Kidney failure Father    Cancer Paternal Grandmother        prostate   Prostate cancer Paternal Grandfather      Social History   Socioeconomic History   Marital status: Married    Spouse name: Not on file   Number of children: 2   Years of education: Not on file   Highest education level: Not on file  Occupational History   Occupation: full time Super  Tobacco Use   Smoking status:  Former    Packs/day: 1.00    Years: 40.00    Total pack years: 40.00    Types: Cigarettes    Quit date: 05/15/2013    Years since quitting: 8.4   Smokeless tobacco: Never  Vaping Use   Vaping Use: Never used  Substance and Sexual Activity   Alcohol use: Yes    Alcohol/week: 1.0 standard drink of alcohol    Types: 1 Standard drinks or equivalent per week   Drug use: No   Sexual activity: Not on file  Other Topics Concern   Not on file  Social History Narrative   Not on file   Social Determinants of Health   Financial Resource Strain: Not on file  Food Insecurity: Not on file  Transportation Needs: Not on file  Physical Activity: Not on file  Stress: Not on file  Social Connections: Not on file  Intimate Partner Violence: Not on file     Allergies  Allergen Reactions   Other Shortness Of Breath and Itching    Chicken feathers   Sulfa Antibiotics Rash and Hives     Outpatient Medications Prior to Visit  Medication Sig Dispense Refill   Accu-Chek  Softclix Lancets lancets Use as instructed daily to check sugars. DX R73.03 100 each 12   anastrozole (ARIMIDEX) 1 MG tablet Take 1 mg by mouth 3 (three) times a week.     atorvastatin (LIPITOR) 20 MG tablet Take 1 tablet (20 mg total) by mouth daily. 90 tablet 3   Blood Glucose Monitoring Suppl (ACCU-CHEK GUIDE ME) w/Device KIT USE DAILY AS NEEDED TO CHECK SUGAR 1 kit 0   Cholecalciferol (DIALYVITE VITAMIN D 5000) 125 MCG (5000 UT) capsule Take 5,000 Units by mouth daily.     cyclobenzaprine (FLEXERIL) 10 MG tablet Take 10 mg by mouth 3 (three) times daily. As needed     diphenhydrAMINE (BENADRYL) 25 MG tablet Take 25 mg by mouth at bedtime as needed for sleep.     doxazosin (CARDURA) 2 MG tablet Take 1 tablet (2 mg total) by mouth daily. 90 tablet 1   finasteride (PROSCAR) 5 MG tablet Take 5 mg by mouth daily.     glucose blood (ACCU-CHEK GUIDE) test strip Use as needed daily to check sugars. Dx R73.03 100 each 12   ibuprofen  (ADVIL) 200 MG tablet Take 200-400 mg by mouth every 6 (six) hours as needed for headache or moderate pain.     Pediatric Multivitamins-Iron (FLINTSTONES COMPLETE PO) Take 1 tablet by mouth daily.     sertraline (ZOLOFT) 100 MG tablet Take 1 tablet (100 mg total) by mouth daily. 90 tablet 3   testosterone cypionate (DEPOTESTOSTERONE CYPIONATE) 200 MG/ML injection 4 mg weekly per patient     traMADol (ULTRAM) 50 MG tablet Take 1 tablet (50 mg total) by mouth every 8 (eight) hours as needed for moderate pain or severe pain. 20 tablet 0   XYOSTED 75 MG/0.5ML SOAJ Inject into the skin.     zinc gluconate 50 MG tablet Take 50 mg by mouth daily.     No facility-administered medications prior to visit.    Review of Systems  Constitutional:  Negative for chills, fever, malaise/fatigue and weight loss.  HENT:  Negative for congestion, sinus pain and sore throat.   Eyes: Negative.   Respiratory:  Positive for cough, shortness of breath and wheezing. Negative for hemoptysis and sputum production.   Cardiovascular:  Negative for chest pain, palpitations, orthopnea, claudication and leg swelling.  Gastrointestinal:  Positive for heartburn. Negative for abdominal pain, nausea and vomiting.  Genitourinary: Negative.   Musculoskeletal:  Positive for joint pain. Negative for myalgias.  Skin:  Negative for rash.  Neurological:  Negative for weakness.  Endo/Heme/Allergies: Negative.   Psychiatric/Behavioral: Negative.     Objective:   Vitals:   09/29/21 1512  BP: 124/72  Pulse: 80  SpO2: 91%  Weight: 299 lb 12.8 oz (136 kg)  Height: _0  (1.727 m)     Physical Exam Constitutional:      General: He is not in acute distress.    Appearance: He is obese.  HENT:     Head: Normocephalic and atraumatic.  Eyes:     Extraocular Movements: Extraocular movements intact.     Conjunctiva/sclera: Conjunctivae normal.     Pupils: Pupils are equal, round, and reactive to light.  Cardiovascular:      Rate and Rhythm: Normal rate and regular rhythm.     Pulses: Normal pulses.     Heart sounds: Normal heart sounds. No murmur heard. Pulmonary:     Effort: Pulmonary effort is normal.     Breath sounds: Decreased air movement present.  Abdominal:  General: Bowel sounds are normal.     Palpations: Abdomen is soft.  Musculoskeletal:     Right lower leg: No edema.     Left lower leg: No edema.  Lymphadenopathy:     Cervical: No cervical adenopathy.  Skin:    General: Skin is warm and dry.  Neurological:     General: No focal deficit present.     Mental Status: He is alert.  Psychiatric:        Mood and Affect: Mood normal.        Behavior: Behavior normal.        Thought Content: Thought content normal.        Judgment: Judgment normal.    CBC    Component Value Date/Time   WBC 6.5 06/18/2021 0845   RBC 4.98 06/18/2021 0845   HGB 16.1 06/18/2021 0845   HGB 17.4 12/16/2019 0924   HCT 47.7 06/18/2021 0845   HCT 51.8 (H) 12/16/2019 0924   PLT 200.0 06/18/2021 0845   PLT 270 12/16/2019 0924   MCV 95.8 06/18/2021 0845   MCV 97 12/16/2019 0924   MCH 32.5 12/16/2019 0924   MCHC 33.7 06/18/2021 0845   RDW 13.7 06/18/2021 0845   RDW 12.2 12/16/2019 0924   LYMPHSABS 2.7 03/01/2017 1001   MONOABS 1.0 03/01/2017 1001   EOSABS 0.2 03/01/2017 1001   BASOSABS 0.1 03/01/2017 1001      Latest Ref Rng & Units 06/18/2021    8:45 AM 11/05/2020   12:34 PM 12/16/2019    9:24 AM  BMP  Glucose 70 - 99 mg/dL 112  231  122   BUN 6 - 23 mg/dL _0 Creatinine 0.40 - 1.50 mg/dL 0.98  0.94  0.93   BUN/Creat Ratio 10 - 24   18   Sodium 135 - 145 mEq/L 138  136  137   Potassium 3.5 - 5.1 mEq/L 4.4  4.1  4.6   Chloride 96 - 112 mEq/L 104  102  103   CO2 19 - 32 mEq/L _1 Calcium 8.4 - 10.5 mg/dL 9.1  9.1  9.4    Chest imaging: [facility] CT Chest Scan 09/02/21 Mediastinum/Nodes: No discrete thyroid nodules. Unremarkable esophagus. No pathologically enlarged axillary,  mediastinal or hilar lymph nodes, noting limited sensitivity for the detection of hilar adenopathy on this noncontrast study.   Lungs/Pleura: No pneumothorax. No pleural effusion. Mild-to-moderate paraseptal and centrilobular emphysema with diffuse bronchial wall thickening. No acute consolidative airspace disease or lung masses. A few scattered solid pulmonary nodules in both lungs, largest 7.9 mm in volume derived mean diameter in the peripheral right middle lobe (series 9/image 150).  PFT:     No data to display          Labs:  Path:  Echo 12/2019: EF 55-60%. Grade I diastolic dysfunction. RV systolic function is normal. RV size is mildly enlarged.   Heart Catheterization:  Home Sleep Study 2019: Severe central/obstructive sleep apnea with AHI 70/hr    Assessment & Plan:   Centrilobular emphysema ([place]) - Plan: Pulmonary Function Test  Pulmonary nodules  Severe obstructive sleep apnea-hypopnea syndrome - Plan: Split night study  Discussion: Britt Jahden Schara is a 61 year old male, former smoker with obstructive sleep apnea who is referred to pulmonary clinic for dyspnea.   He has centrilobular emphysema as noted on his recent CT Chest scan. He is to try trelegy ellipta 1 puff daily,  if he notices improvement we will send in prescription for him to continue.   He has history of severe sleep apnea with AHI of 70/hr in 2019 with mixed central/obtructive apneas. We will schedule him for split night study.   Follow up in 2 months with pulmonary function tests.  Freda Jackson, MD [facility] Office: [phone]   Current Outpatient Medications:    Accu-Chek Softclix Lancets lancets, Use as instructed daily to check sugars. DX R73.03, Disp: 100 each, Rfl: 12   anastrozole (ARIMIDEX) 1 MG tablet, Take 1 mg by mouth 3 (three) times a week., Disp: , Rfl:    atorvastatin (LIPITOR) 20 MG tablet, Take 1 tablet (20 mg total) by mouth daily., Disp:  90 tablet, Rfl: 3   Blood Glucose Monitoring Suppl (ACCU-CHEK GUIDE ME) w/Device KIT, USE DAILY AS NEEDED TO CHECK SUGAR, Disp: 1 kit, Rfl: 0   Cholecalciferol (DIALYVITE VITAMIN D 5000) 125 MCG (5000 UT) capsule, Take 5,000 Units by mouth daily., Disp: , Rfl:    cyclobenzaprine (FLEXERIL) 10 MG tablet, Take 10 mg by mouth 3 (three) times daily. As needed, Disp: , Rfl:    diphenhydrAMINE (BENADRYL) 25 MG tablet, Take 25 mg by mouth at bedtime as needed for sleep., Disp: , Rfl:    doxazosin (CARDURA) 2 MG tablet, Take 1 tablet (2 mg total) by mouth daily., Disp: 90 tablet, Rfl: 1   finasteride (PROSCAR) 5 MG tablet, Take 5 mg by mouth daily., Disp: , Rfl:    Fluticasone-Umeclidin-Vilant (TRELEGY ELLIPTA) 100-62.5-25 MCG/ACT AEPB, Inhale 1 puff into the lungs daily., Disp: 2 each, Rfl: 0   glucose blood (ACCU-CHEK GUIDE) test strip, Use as needed daily to check sugars. Dx R73.03, Disp: 100 each, Rfl: 12   ibuprofen (ADVIL) 200 MG tablet, Take 200-400 mg by mouth every 6 (six) hours as needed for headache or moderate pain., Disp: , Rfl:    Pediatric Multivitamins-Iron (FLINTSTONES COMPLETE PO), Take 1 tablet by mouth daily., Disp: , Rfl:    sertraline (ZOLOFT) 100 MG tablet, Take 1 tablet (100 mg total) by mouth daily., Disp: 90 tablet, Rfl: 3   testosterone cypionate (DEPOTESTOSTERONE CYPIONATE) 200 MG/ML injection, 4 mg weekly per patient, Disp: , Rfl:    traMADol (ULTRAM) 50 MG tablet, Take 1 tablet (50 mg total) by mouth every 8 (eight) hours as needed for moderate pain or severe pain., Disp: 20 tablet, Rfl: 0   XYOSTED 75 MG/0.5ML SOAJ, Inject into the skin., Disp: , Rfl:    zinc gluconate 50 MG tablet, Take 50 mg by mouth daily., Disp: , Rfl:

## 2021-10-10 ENCOUNTER — Encounter: Payer: Self-pay | Admitting: Pulmonary Disease

## 2021-10-11 ENCOUNTER — Other Ambulatory Visit: Payer: Self-pay | Admitting: Physical Medicine and Rehabilitation

## 2021-10-17 ENCOUNTER — Encounter: Payer: Self-pay | Admitting: Pulmonary Disease

## 2021-10-18 ENCOUNTER — Encounter: Payer: Self-pay | Admitting: Physical Medicine and Rehabilitation

## 2021-10-19 ENCOUNTER — Other Ambulatory Visit: Payer: Self-pay | Admitting: Physical Medicine and Rehabilitation

## 2021-10-19 DIAGNOSIS — M5416 Radiculopathy, lumbar region: Secondary | ICD-10-CM

## 2021-10-20 NOTE — Telephone Encounter (Signed)
Dr. Erin Fulling, please see mychart messages sent by pt and and advise.

## 2021-10-22 MED ORDER — ALBUTEROL SULFATE HFA 108 (90 BASE) MCG/ACT IN AERS: 2.0000 | INHALATION_SPRAY | Freq: Four times a day (QID) | RESPIRATORY_TRACT | 6 refills | Status: AC | PRN

## 2021-10-25 MED ORDER — TRELEGY ELLIPTA 100-62.5-25 MCG/ACT IN AEPB: 1.0000 | INHALATION_SPRAY | Freq: Every day | RESPIRATORY_TRACT | 5 refills | Status: DC

## 2021-11-22 ENCOUNTER — Other Ambulatory Visit: Payer: Self-pay | Admitting: Nurse Practitioner

## 2021-12-09 NOTE — Telephone Encounter (Signed)
Attempted to call patient this morning. Mitchell Lewis received a fax from [employer] stating that this patient is no longer enrolled in an active plan as of June 2023. I called to get his updated insurance information was unable to leave a voicemail.

## 2021-12-20 ENCOUNTER — Ambulatory Visit: Payer: Managed Care, Other (non HMO) | Admitting: Pulmonary Disease

## 2021-12-20 ENCOUNTER — Encounter: Payer: Self-pay | Admitting: Pulmonary Disease

## 2021-12-20 VITALS — BP 134/72 | HR 91 | Ht 68.0 in | Wt 295.2 lb

## 2021-12-20 DIAGNOSIS — G4733 Obstructive sleep apnea (adult) (pediatric): Secondary | ICD-10-CM | POA: Diagnosis not present

## 2021-12-20 DIAGNOSIS — J432 Centrilobular emphysema: Secondary | ICD-10-CM | POA: Diagnosis not present

## 2021-12-20 LAB — PULMONARY FUNCTION TEST
DL/VA % pred: 128 %
DL/VA: 5.42 ml/min/mmHg/L
DLCO cor % pred: 146 %
DLCO cor: 37.41 ml/min/mmHg
DLCO unc % pred: 146 %
DLCO unc: 37.41 ml/min/mmHg
FEF 25-75 Post: 5.39 L/sec
FEF 25-75 Pre: 4.97 L/sec
FEF2575-%Change-Post: 8 %
FEF2575-%Pred-Post: 204 %
FEF2575-%Pred-Pre: 188 %
FEV1-%Change-Post: 1 %
FEV1-%Pred-Post: 119 %
FEV1-%Pred-Pre: 117 %
FEV1-Post: 3.89 L
FEV1-Pre: 3.84 L
FEV1FVC-%Change-Post: 1 %
FEV1FVC-%Pred-Pre: 114 %
FEV6-%Change-Post: 0 %
FEV6-%Pred-Post: 107 %
FEV6-%Pred-Pre: 108 %
FEV6-Post: 4.43 L
FEV6-Pre: 4.47 L
FEV6FVC-%Change-Post: 0 %
FEV6FVC-%Pred-Post: 105 %
FEV6FVC-%Pred-Pre: 105 %
FVC-%Change-Post: 0 %
FVC-%Pred-Post: 103 %
FVC-%Pred-Pre: 102 %
FVC-Post: 4.48 L
FVC-Pre: 4.47 L
Post FEV1/FVC ratio: 87 %
Post FEV6/FVC ratio: 100 %
Pre FEV1/FVC ratio: 86 %
Pre FEV6/FVC Ratio: 100 %
RV % pred: 70 %
RV: 1.55 L
TLC % pred: 103 %
TLC: 6.81 L

## 2021-12-20 NOTE — Progress Notes (Signed)
Synopsis: Referred in August 2023 for shortness of breath by Eric Form, NP  Subjective:   PATIENT ID: Mitchell Lewis. GENDER: male DOB: 1960/02/09, MRN: [id number]  HPI  Chief Complaint  Patient presents with   Follow-up    F/U after PFT. States he stopped using the Trelegy about 2 weeks ago due to the side effects. Increased nervousness and unable to sleep at night.    Mitchell Lewis is a 61 year old male, former smoker with obstructive sleep apnea who returns to pulmonary clinic for emphysema.   He was started on trelegy ellipta 1 puff daily and as needed albuterol at last visit. He felt spacey on it. He is now taking mullin drops.   PFTs are within normal limits today.   We are working on getting him scheduled for sleep study.   He has been walking a bit more. He has lost 4lbs.   OV 09/29/21 He is referred from the lung cancer screening team for shortness of breath. He reports having shortness of breath over recent months with rare cough and some wheezing noted by hiw wife. He reports progressive weight gain over the past 3 years. He does report waking up from sleep with shortness of breath. His wife reports apneic events and gasping events in his sleep. He will sometimes sleep in a recliner which helps him sleep.   He quit smoking in 2015. He has a 60 pack year history as he smoked 1.5 packs for 40 years. He denies family history of lung disease or lung cancer.   Past Medical History:  Diagnosis Date   Colon polyp    Fatty liver    History of colon polyps 2018   Prediabetes 09/15/2017   Sleep apnea    Subareolar mass of left breast 11/10/2020     Family History  Problem Relation Age of Onset   Breast cancer Mother 63   Cancer Mother 34       Breast cancer with radition   Diabetes Mother    Cancer Father        pancreatic   Heart disease Father    Kidney failure Father    Cancer Paternal Grandmother        prostate   Prostate cancer Paternal Grandfather       Social History   Socioeconomic History   Marital status: Married    Spouse name: Not on file   Number of children: 2   Years of education: Not on file   Highest education level: Not on file  Occupational History   Occupation: full time Super  Tobacco Use   Smoking status: Former    Packs/day: 1.00    Years: 40.00    Total pack years: 40.00    Types: Cigarettes    Quit date: 05/15/2013    Years since quitting: 8.6   Smokeless tobacco: Never  Vaping Use   Vaping Use: Never used  Substance and Sexual Activity   Alcohol use: Yes    Alcohol/week: 1.0 standard drink of alcohol    Types: 1 Standard drinks or equivalent per week   Drug use: No   Sexual activity: Not on file  Other Topics Concern   Not on file  Social History Narrative   Not on file   Social Determinants of Health   Financial Resource Strain: Not on file  Food Insecurity: Not on file  Transportation Needs: Not on file  Physical Activity: Not on file  Stress: Not  on file  Social Connections: Not on file  Intimate Partner Violence: Not on file     Allergies  Allergen Reactions   Other Shortness Of Breath and Itching    Chicken feathers   Sulfa Antibiotics Rash and Hives     Outpatient Medications Prior to Visit  Medication Sig Dispense Refill   Accu-Chek Softclix Lancets lancets Use as instructed daily to check sugars. DX R73.03 100 each 12   albuterol (VENTOLIN HFA) 108 (90 Base) MCG/ACT inhaler Inhale 2 puffs into the lungs every 6 (six) hours as needed for wheezing or shortness of breath. 8 g 6   anastrozole (ARIMIDEX) 1 MG tablet Take 1 mg by mouth 3 (three) times a week.     Blood Glucose Monitoring Suppl (ACCU-CHEK GUIDE ME) w/Device KIT USE DAILY AS NEEDED TO CHECK SUGAR 1 kit 0   Cholecalciferol (DIALYVITE VITAMIN D 5000) 125 MCG (5000 UT) capsule Take 5,000 Units by mouth daily.     cyclobenzaprine (FLEXERIL) 10 MG tablet Take 10 mg by mouth 3 (three) times daily. As needed      diphenhydrAMINE (BENADRYL) 25 MG tablet Take 25 mg by mouth at bedtime as needed for sleep.     doxazosin (CARDURA) 2 MG tablet Take 1 tablet (2 mg total) by mouth daily. 90 tablet 1   finasteride (PROSCAR) 5 MG tablet Take 5 mg by mouth daily.     glucose blood (ACCU-CHEK GUIDE) test strip Use as needed daily to check sugars. Dx R73.03 100 each 12   ibuprofen (ADVIL) 200 MG tablet Take 200-400 mg by mouth every 6 (six) hours as needed for headache or moderate pain.     Pediatric Multivitamins-Iron (FLINTSTONES COMPLETE PO) Take 1 tablet by mouth daily.     sertraline (ZOLOFT) 100 MG tablet TAKE 1 TABLET BY MOUTH EVERY DAY 90 tablet 3   testosterone cypionate (DEPOTESTOSTERONE CYPIONATE) 200 MG/ML injection 4 mg weekly per patient     traMADol (ULTRAM) 50 MG tablet Take 1 tablet (50 mg total) by mouth every 8 (eight) hours as needed for moderate pain or severe pain. 20 tablet 0   XYOSTED 75 MG/0.5ML SOAJ Inject into the skin.     zinc gluconate 50 MG tablet Take 50 mg by mouth daily.     atorvastatin (LIPITOR) 20 MG tablet Take 1 tablet (20 mg total) by mouth daily. 90 tablet 3   Fluticasone-Umeclidin-Vilant (TRELEGY ELLIPTA) 100-62.5-25 MCG/ACT AEPB Inhale 1 puff into the lungs daily. 60 each 5   No facility-administered medications prior to visit.    Review of Systems  Constitutional:  Negative for chills, fever, malaise/fatigue and weight loss.  HENT:  Negative for congestion, sinus pain and sore throat.   Eyes: Negative.   Respiratory:  Positive for shortness of breath. Negative for cough, hemoptysis, sputum production and wheezing.   Cardiovascular:  Negative for chest pain, palpitations, orthopnea, claudication and leg swelling.  Gastrointestinal:  Positive for heartburn. Negative for abdominal pain, nausea and vomiting.  Genitourinary: Negative.   Musculoskeletal:  Positive for joint pain. Negative for myalgias.  Skin:  Negative for rash.  Neurological:  Negative for weakness.   Endo/Heme/Allergies: Negative.   Psychiatric/Behavioral: Negative.     Objective:   Vitals:   12/20/21 0958  BP: 134/72  Pulse: 91  SpO2: 96%  Weight: 295 lb 3.2 oz (133.9 kg)  Height: 5' 8" (1.727 m)     Physical Exam Constitutional:      General: He is not in acute distress.  Appearance: He is obese.  HENT:     Head: Normocephalic and atraumatic.  Eyes:     Conjunctiva/sclera: Conjunctivae normal.  Cardiovascular:     Rate and Rhythm: Normal rate and regular rhythm.     Pulses: Normal pulses.     Heart sounds: Normal heart sounds. No murmur heard. Pulmonary:     Effort: Pulmonary effort is normal.     Breath sounds: Decreased air movement present. No wheezing or rales.  Musculoskeletal:     Right lower leg: No edema.     Left lower leg: No edema.  Skin:    General: Skin is warm and dry.  Neurological:     General: No focal deficit present.     Mental Status: He is alert.  Psychiatric:        Mood and Affect: Mood normal.        Behavior: Behavior normal.        Thought Content: Thought content normal.        Judgment: Judgment normal.    CBC    Component Value Date/Time   WBC 6.5 06/18/2021 0845   RBC 4.98 06/18/2021 0845   HGB 16.1 06/18/2021 0845   HGB 17.4 12/16/2019 0924   HCT 47.7 06/18/2021 0845   HCT 51.8 (H) 12/16/2019 0924   PLT 200.0 06/18/2021 0845   PLT 270 12/16/2019 0924   MCV 95.8 06/18/2021 0845   MCV 97 12/16/2019 0924   MCH 32.5 12/16/2019 0924   MCHC 33.7 06/18/2021 0845   RDW 13.7 06/18/2021 0845   RDW 12.2 12/16/2019 0924   LYMPHSABS 2.7 03/01/2017 1001   MONOABS 1.0 03/01/2017 1001   EOSABS 0.2 03/01/2017 1001   BASOSABS 0.1 03/01/2017 1001      Latest Ref Rng & Units 06/18/2021    8:45 AM 11/05/2020   12:34 PM 12/16/2019    9:24 AM  BMP  Glucose 70 - 99 mg/dL 112  231  122   BUN 6 - 23 mg/dL _0 Creatinine 0.40 - 1.50 mg/dL 0.98  0.94  0.93   BUN/Creat Ratio 10 - 24   18   Sodium 135 - 145 mEq/L 138  136   137   Potassium 3.5 - 5.1 mEq/L 4.4  4.1  4.6   Chloride 96 - 112 mEq/L 104  102  103   CO2 19 - 32 mEq/L _1 Calcium 8.4 - 10.5 mg/dL 9.1  9.1  9.4    Chest imaging: [facility] CT Chest Scan 09/02/21 Mediastinum/Nodes: No discrete thyroid nodules. Unremarkable esophagus. No pathologically enlarged axillary, mediastinal or hilar lymph nodes, noting limited sensitivity for the detection of hilar adenopathy on this noncontrast study.   Lungs/Pleura: No pneumothorax. No pleural effusion. Mild-to-moderate paraseptal and centrilobular emphysema with diffuse bronchial wall thickening. No acute consolidative airspace disease or lung masses. A few scattered solid pulmonary nodules in both lungs, largest 7.9 mm in volume derived mean diameter in the peripheral right middle lobe (series 9/image 150).  PFT:    Latest Ref Rng & Units 12/20/2021    9:15 AM  PFT Results  FVC-Pre L 4.47  P  FVC-Predicted Pre % 102  P  FVC-Post L 4.48  P  FVC-Predicted Post % 103  P  Pre FEV1/FVC % % 86  P  Post FEV1/FCV % % 87  P  FEV1-Pre L 3.84  P  FEV1-Predicted Pre % 117  P  FEV1-Post L 3.89  P  DLCO uncorrected ml/min/mmHg 37.41  P  DLCO UNC% % 146  P  DLCO corrected ml/min/mmHg 37.41  P  DLCO COR %Predicted % 146  P  DLVA Predicted % 128  P  TLC L 6.81  P  TLC % Predicted % 103  P  RV % Predicted % 70  P    P Preliminary result    Labs:  Path:  Echo 12/2019: EF 55-60%. Grade I diastolic dysfunction. RV systolic function is normal. RV size is mildly enlarged.   Heart Catheterization:  Home Sleep Study 2019: Severe central/obstructive sleep apnea with AHI 70/hr    Assessment & Plan:   Centrilobular emphysema ([facility])  Severe obstructive sleep apnea-hypopnea syndrome  Discussion: Mitchell Lewis is a 61 year old male, former smoker with obstructive sleep apnea who returns to pulmonary clinic for empysema.   He has centrilobular emphysema as noted on his recent CT Chest scan. His  PFTs are within normal limits. We will hold off on inhaler therapy at this time as he did not tolerate trelegy well.   He is working on weight loss. He will need to be scheduled for sleep study in the near future.He has history of severe sleep apnea with AHI of 70/hr in 2019 with mixed central/obtructive apneas.  Follow up in 6 months.  Freda Jackson, MD [facility] Office: [phone]   Current Outpatient Medications:    Accu-Chek Softclix Lancets lancets, Use as instructed daily to check sugars. DX R73.03, Disp: 100 each, Rfl: 12   albuterol (VENTOLIN HFA) 108 (90 Base) MCG/ACT inhaler, Inhale 2 puffs into the lungs every 6 (six) hours as needed for wheezing or shortness of breath., Disp: 8 g, Rfl: 6   anastrozole (ARIMIDEX) 1 MG tablet, Take 1 mg by mouth 3 (three) times a week., Disp: , Rfl:    Blood Glucose Monitoring Suppl (ACCU-CHEK GUIDE ME) w/Device KIT, USE DAILY AS NEEDED TO CHECK SUGAR, Disp: 1 kit, Rfl: 0   Cholecalciferol (DIALYVITE VITAMIN D 5000) 125 MCG (5000 UT) capsule, Take 5,000 Units by mouth daily., Disp: , Rfl:    cyclobenzaprine (FLEXERIL) 10 MG tablet, Take 10 mg by mouth 3 (three) times daily. As needed, Disp: , Rfl:    diphenhydrAMINE (BENADRYL) 25 MG tablet, Take 25 mg by mouth at bedtime as needed for sleep., Disp: , Rfl:    doxazosin (CARDURA) 2 MG tablet, Take 1 tablet (2 mg total) by mouth daily., Disp: 90 tablet, Rfl: 1   finasteride (PROSCAR) 5 MG tablet, Take 5 mg by mouth daily., Disp: , Rfl:    glucose blood (ACCU-CHEK GUIDE) test strip, Use as needed daily to check sugars. Dx R73.03, Disp: 100 each, Rfl: 12   ibuprofen (ADVIL) 200 MG tablet, Take 200-400 mg by mouth every 6 (six) hours as needed for headache or moderate pain., Disp: , Rfl:    Pediatric Multivitamins-Iron (FLINTSTONES COMPLETE PO), Take 1 tablet by mouth daily., Disp: , Rfl:    sertraline (ZOLOFT) 100 MG tablet, TAKE 1 TABLET BY MOUTH EVERY DAY, Disp: 90 tablet,  Rfl: 3   testosterone cypionate (DEPOTESTOSTERONE CYPIONATE) 200 MG/ML injection, 4 mg weekly per patient, Disp: , Rfl:    traMADol (ULTRAM) 50 MG tablet, Take 1 tablet (50 mg total) by mouth every 8 (eight) hours as needed for moderate pain or severe pain., Disp: 20 tablet, Rfl: 0   XYOSTED 75 MG/0.5ML SOAJ, Inject into the skin., Disp: , Rfl:    zinc gluconate  50 MG tablet, Take 50 mg by mouth daily., Disp: , Rfl:

## 2021-12-20 NOTE — Progress Notes (Signed)
Full PFT Performed Today  

## 2021-12-20 NOTE — Patient Instructions (Signed)
Full PFT Performed Today  

## 2021-12-20 NOTE — Telephone Encounter (Signed)
Patient came in for his OV today. He stated that he still has [employer]. Christella Scheuermann gave him the wrong card with the wrong group number. Patient stated that his insurance information was updated today in his chart.

## 2021-12-20 NOTE — Patient Instructions (Signed)
We will work on getting your CPAP titration study scheduled  OK to stop Trelegy as you have  Your breathing tests are within normal limits today  Follow up in 6 months

## 2021-12-24 ENCOUNTER — Encounter: Payer: Self-pay | Admitting: Pulmonary Disease

## 2021-12-27 ENCOUNTER — Ambulatory Visit: Payer: Managed Care, Other (non HMO) | Admitting: Nurse Practitioner

## 2021-12-27 VITALS — BP 130/80 | HR 71 | Temp 99.8°F | Resp 14 | Ht 68.0 in | Wt 290.1 lb

## 2021-12-27 DIAGNOSIS — E1165 Type 2 diabetes mellitus with hyperglycemia: Secondary | ICD-10-CM

## 2021-12-27 DIAGNOSIS — R911 Solitary pulmonary nodule: Secondary | ICD-10-CM

## 2021-12-27 DIAGNOSIS — R7989 Other specified abnormal findings of blood chemistry: Secondary | ICD-10-CM | POA: Diagnosis not present

## 2021-12-27 DIAGNOSIS — K760 Fatty (change of) liver, not elsewhere classified: Secondary | ICD-10-CM | POA: Diagnosis not present

## 2021-12-27 DIAGNOSIS — J432 Centrilobular emphysema: Secondary | ICD-10-CM

## 2021-12-27 LAB — COMPREHENSIVE METABOLIC PANEL
ALT: 47 U/L (ref 0–53)
AST: 34 U/L (ref 0–37)
Albumin: 4.4 g/dL (ref 3.5–5.2)
Alkaline Phosphatase: 74 U/L (ref 39–117)
BUN: 21 mg/dL (ref 6–23)
CO2: 27 mEq/L (ref 19–32)
Calcium: 9.2 mg/dL (ref 8.4–10.5)
Chloride: 104 mEq/L (ref 96–112)
Creatinine, Ser: 1.02 mg/dL (ref 0.40–1.50)
GFR: 78.28 mL/min (ref >60.00)
Glucose, Bld: 119 mg/dL — ABNORMAL HIGH (ref 70–99)
Potassium: 4.5 mEq/L (ref 3.5–5.1)
Sodium: 137 mEq/L (ref 135–145)
Total Bilirubin: 0.5 mg/dL (ref 0.2–1.2)
Total Protein: 7.2 g/dL (ref 6.0–8.3)

## 2021-12-27 LAB — CBC
HCT: 47.9 % (ref 39.0–52.0)
Hemoglobin: 16.2 g/dL (ref 13.0–17.0)
MCHC: 33.8 g/dL (ref 30.0–36.0)
MCV: 96.1 fl (ref 78.0–100.0)
Platelets: 227 10*3/uL (ref 150.0–400.0)
RBC: 4.99 Mil/uL (ref 4.22–5.81)
RDW: 14 % (ref 11.5–15.5)
WBC: 7.9 10*3/uL (ref 4.0–10.5)

## 2021-12-27 LAB — POCT GLYCOSYLATED HEMOGLOBIN (HGB A1C): Hemoglobin A1C: 6.3 % — AB (ref 4.0–5.6)

## 2021-12-27 NOTE — Assessment & Plan Note (Signed)
Patient currently maintained on albuterol inhaler as needed.  Recently evaluated by pulmonology with PFTs and normal limits.  Patient is doing mullen oil

## 2021-12-27 NOTE — Patient Instructions (Signed)
Nice to see you today I will be in touch with the labs once I have the results Follow up with me in 6 months for your physical at that point, sooner if you need me

## 2021-12-27 NOTE — Progress Notes (Signed)
Established Patient Office Visit  Subjective   Patient ID: Mitchell Yin., male    DOB: 07-18-1960  Age: 61 y.o. MRN: [id number]  Chief Complaint  Patient presents with   Diabetes    Follow up      DM2: States that he is being chekcing his blood sugar in the am 115-120 and at night always below 100. Statea that he is fasting and not eating breakfast but will have lunch and dinner.  Elevated LFTs: Patient is still being followed by GI and is working on weight reduction.  Patient did have a question about the new medication that is not on the market yet and she will promise in regards to helping reverse liver disease. Retatrutide   Emphysema: Patient was recently evaluated by Mitchell Lewis. [facility] pulmonologist and had repeat PFTs that were within normal limits.  Patient has discontinued Trelegy and using albuterol inhaler as needed. He has been using Mullen oil drops instead      Review of Systems  Constitutional:  Negative for chills and fever.  Respiratory:  Negative for shortness of breath.   Cardiovascular:  Negative for chest pain.  Neurological:  Negative for headaches.      Objective:     BP 130/80   Pulse 71   Temp 99.8 F (37.7 C)   Resp 14   Ht '5\' 8"'$  (1.727 m)   Wt 290 lb 2 oz (131.6 kg)   SpO2 93%   BMI 44.11 kg/m  BP Readings from Last 3 Encounters:  12/27/21 130/80  12/20/21 134/72  09/29/21 124/72   Wt Readings from Last 3 Encounters:  12/27/21 290 lb 2 oz (131.6 kg)  12/20/21 295 lb 3.2 oz (133.9 kg)  09/29/21 299 lb 12.8 oz (136 kg)      Physical Exam Vitals and nursing note reviewed.  Constitutional:      Appearance: Normal appearance. He is obese.  Cardiovascular:     Rate and Rhythm: Normal rate and regular rhythm.     Pulses:          Dorsalis pedis pulses are 2+ on the right side and 2+ on the left side.     Heart sounds: Normal heart sounds.  Pulmonary:     Effort: Pulmonary effort is normal.     Breath sounds: Normal breath  sounds.  Abdominal:     General: Bowel sounds are normal.  Musculoskeletal:     Right lower leg: No edema.     Left lower leg: No edema.       Feet:  Feet:     Right foot:     Skin integrity: Skin breakdown present.     Toenail Condition: Right toenails are normal.     Left foot:     Skin integrity: Skin integrity normal.     Toenail Condition: Left toenails are normal.  Neurological:     Mental Status: He is alert.      Results for orders placed or performed in visit on 12/27/21  POCT glycosylated hemoglobin (Hb A1C)  Result Value Ref Range   Hemoglobin A1C 6.3 (A) 4.0 - 5.6 %   HbA1c POC (<> result, manual entry)     HbA1c, POC (prediabetic range)     HbA1c, POC (controlled diabetic range)        The ASCVD Risk score (Arnett DK, et al., 2019) failed to calculate for the following reasons:   The valid total cholesterol range is 130 to 320 mg/dL  Assessment & Plan:   Problem List Items Addressed This Visit       Respiratory   Pulmonary nodule    Noticed on LDCT.  Patient is due for follow-up CT scan of lung.  Order is placed patient states he got up information about approval through his insurance waiting for it to be scheduled.      Centrilobular emphysema ([place])    Patient currently maintained on albuterol inhaler as needed.  Recently evaluated by pulmonology with PFTs and normal limits.  Patient is doing mullen oil        Digestive   Hepatic steatosis    Currently followed by GI.  Patient is working on weight loss and has had weight loss since last office visit.  Patient was interested in injectable medication he has tried semaglutide and Trulicity in the past.  He was interested in a new range it is not fully FDA approved yet.  Did give patient information on Mounjaro to consider since he does carry the diagnosis of diabetes and obesity      Relevant Orders   CBC   Comprehensive metabolic panel     Endocrine   Type 2 diabetes mellitus with  hyperglycemia, without long-term current use of insulin (HCC) - Primary    A1c 6.3% today.  Still controlled currently on diet and lifestyle modifications.  Consider adding GLP/GIP agonist like Mounjaro to aid in weight loss      Relevant Orders   POCT glycosylated hemoglobin (Hb A1C) (Completed)   CBC   Comprehensive metabolic panel     Other   Morbid obesity ([place])    Patient is working on lifestyle modifications.  He has lost weight since last office visit.  Continue working lifestyle modifications      Elevated LFTs    Pending hepatic function test today      Relevant Orders   Comprehensive metabolic panel    Return in about 6 months (around 06/27/2022) for CPE and Labs.    Mitchell Garret, NP

## 2021-12-27 NOTE — Assessment & Plan Note (Signed)
A1c 6.3% today.  Still controlled currently on diet and lifestyle modifications.  Consider adding GLP/GIP agonist like Mounjaro to aid in weight loss

## 2021-12-27 NOTE — Assessment & Plan Note (Signed)
Noticed on LDCT.  Patient is due for follow-up CT scan of lung.  Order is placed patient states he got up information about approval through his insurance waiting for it to be scheduled.

## 2021-12-27 NOTE — Assessment & Plan Note (Signed)
Currently followed by GI.  Patient is working on weight loss and has had weight loss since last office visit.  Patient was interested in injectable medication he has tried semaglutide and Trulicity in the past.  He was interested in a new range it is not fully FDA approved yet.  Did give patient information on Mounjaro to consider since he does carry the diagnosis of diabetes and obesity

## 2021-12-27 NOTE — Assessment & Plan Note (Signed)
Patient is working on lifestyle modifications.  He has lost weight since last office visit.  Continue working lifestyle modifications

## 2021-12-27 NOTE — Assessment & Plan Note (Signed)
Pending hepatic function test today

## 2022-01-27 ENCOUNTER — Other Ambulatory Visit: Payer: Self-pay | Admitting: Nurse Practitioner

## 2022-01-27 DIAGNOSIS — R7303 Prediabetes: Secondary | ICD-10-CM

## 2022-01-27 DIAGNOSIS — E785 Hyperlipidemia, unspecified: Secondary | ICD-10-CM

## 2022-01-28 NOTE — Telephone Encounter (Signed)
Called pt but does not have a vm set up to leave a vm.

## 2022-01-28 NOTE — Telephone Encounter (Signed)
Spoke to pt

## 2022-01-28 NOTE — Telephone Encounter (Signed)
Patient returned call,would like a call back

## 2022-03-03 ENCOUNTER — Encounter: Payer: Self-pay | Admitting: Pulmonary Disease

## 2022-03-03 NOTE — Telephone Encounter (Signed)
Received a message from patient checking on the status of his split night study.   PCCs, can you all please advise? Thanks!

## 2022-03-07 NOTE — Telephone Encounter (Signed)
Patient has been scheduled for 2/16 for Sleep study   It is authorized and approved til 06/02/22

## 2022-03-08 ENCOUNTER — Ambulatory Visit
Admission: RE | Admit: 2022-03-08 | Discharge: 2022-03-08 | Disposition: A | Discharge status: Home / Self Care | Payer: Managed Care, Other (non HMO) | Source: Ambulatory Visit | Attending: Acute Care | Admitting: Acute Care

## 2022-03-08 DIAGNOSIS — R0609 Other forms of dyspnea: Secondary | ICD-10-CM

## 2022-03-08 DIAGNOSIS — Z87891 Personal history of nicotine dependence: Secondary | ICD-10-CM

## 2022-03-08 DIAGNOSIS — R911 Solitary pulmonary nodule: Secondary | ICD-10-CM

## 2022-03-10 ENCOUNTER — Telehealth: Payer: Self-pay | Admitting: Acute Care

## 2022-03-10 ENCOUNTER — Other Ambulatory Visit: Payer: Self-pay

## 2022-03-10 DIAGNOSIS — R911 Solitary pulmonary nodule: Secondary | ICD-10-CM

## 2022-03-10 DIAGNOSIS — Z87891 Personal history of nicotine dependence: Secondary | ICD-10-CM

## 2022-03-10 NOTE — Telephone Encounter (Signed)
Called results to patient, using two patient identifiers.  Emphysema and Atherosclerosis, as previously noted. No suspicious findings for lung cancer but pulmonary provider, Eric Form, NP noted that nodule (of previous follow up) has a slight increase in size.  As precaution, she is recommending a follow up LDCT in 6 months versus waiting 12 months.  Patient states he is in agreement and was concerned about the nodule, as a close friend had died recently from lung cancer with mets.  Order placed for 6 months follow up LDCT and results/plan faxed to PCP.    Notes per Gladstone Pih to support this adjustment:  A 6 month follow up should be fine on this. It has grown 3 mm In 6 months. If it grows 3 additional mm in the next 6 months it will be 8.5 mm and we will PET scan it.

## 2022-03-15 ENCOUNTER — Telehealth: Payer: Self-pay | Admitting: Pulmonary Disease

## 2022-03-15 NOTE — Telephone Encounter (Signed)
Patient's wife called because patient needs to get a pre-auth for his upcoming sleep study.  She needs a call back to get the auth. Number.  Please call at [phone]

## 2022-03-18 ENCOUNTER — Ambulatory Visit (HOSPITAL_BASED_OUTPATIENT_CLINIC_OR_DEPARTMENT_OTHER): Payer: Managed Care, Other (non HMO) | Attending: Pulmonary Disease | Admitting: Pulmonary Disease

## 2022-03-18 DIAGNOSIS — G4733 Obstructive sleep apnea (adult) (pediatric): Secondary | ICD-10-CM | POA: Insufficient documentation

## 2022-03-18 DIAGNOSIS — I493 Ventricular premature depolarization: Secondary | ICD-10-CM | POA: Diagnosis not present

## 2022-03-21 DIAGNOSIS — G4733 Obstructive sleep apnea (adult) (pediatric): Secondary | ICD-10-CM

## 2022-03-21 NOTE — Procedures (Signed)
Patient Name: Mitchell Lewis, Mitchell Lewis Date: 03/18/2022 Gender: Male D.O.B: 1960-08-11 Age (years): 61 Referring Provider: Freda Jackson Height (inches): 68 Interpreting Physician: Kara Mead MD, ABSM Weight (lbs): 290 RPSGT: Jorge Ny BMI: 44 MRN: [id number] Neck Size: 20.00 <br> <br> CLINICAL INFORMATION The patient is referred for a BiPAP titration to treat sleep apnea.  He has history of severe sleep apnea with AHI of 70/hr in 2019 with mixed central/obtructive apneas.   SLEEP STUDY TECHNIQUE As per the AASM Manual for the Scoring of Sleep and Associated Events v2.3 (April 2016) with a hypopnea requiring 4% desaturations.  The channels recorded and monitored were frontal, central and occipital EEG, electrooculogram (EOG), submentalis EMG (chin), nasal and oral airflow, thoracic and abdominal wall motion, anterior tibialis EMG, snore microphone, electrocardiogram, and pulse oximetry. Bilevel positive airway pressure (BPAP) was initiated at the beginning of the study and titrated to treat sleep-disordered breathing.  MEDICATIONS Medications self-administered by patient taken the night of the study : N/A  RESPIRATORY PARAMETERS Optimal IPAP Pressure (cm): 21 AHI at Optimal Pressure (/hr) 11 Optimal EPAP Pressure (cm): 17   Overall Minimal O2 (%): 66.0 Minimal O2 at Optimal Pressure (%): 91 SLEEP ARCHITECTURE Start Time: 9:19:50 PM Stop Time: 5:00:43 AM Total Time (min): 460.9 Total Sleep Time (min): 329.5 Sleep Latency (min): 4.3 Sleep Efficiency (%): 71.5% REM Latency (min): 292.5 WASO (min): 127.1 Stage N1 (%): 10.0% Stage N2 (%): 76.5% Stage N3 (%): 0.0% Stage R (%): 13.5 Supine (%): 81.78 Arousal Index (/hr): 58.5     CARDIAC DATA The 2 lead EKG demonstrated sinus rhythm. The mean heart rate was 71.6 beats per minute. Other EKG findings include: PVCs.   LEG MOVEMENT DATA The total Periodic Limb Movements of Sleep (PLMS) were 0. The PLMS index was 0.0. A PLMS  index of <15 is considered normal in adults.  IMPRESSIONS - Severe OSA was noted with AHI of 100/h ,all obstructive events - An optimal PAP pressure could not be selected for this patient based on the available study data. Central apneas emerged on CPAP 16 cm & he could not tolerate high pressure. Hence switched to BiPAP 21/17 but inadequate time to titrate - Central sleep apnea was not noted during this titration (CAI = 4/h). - Severe oxygen desaturations were observed during this titration (min O2 = 66.0%). - The patient snored with moderate snoring volume. - 2-lead EKG demonstrated: PVCs - Clinically significant periodic limb movements were not noted during this study. Arousals associated with PLMs were rare.   DIAGNOSIS - Obstructive Sleep Apnea (G47.33) - Treatment emergent cental apneas   RECOMMENDATIONS - Recommend a trial of Auto-CPAP 10-20 cm with medium full face mask. If central apneas persist after 52month of therapy, can consider switch to Bilevel - Avoid alcohol, sedatives and other CNS depressants that may worsen sleep apnea and disrupt normal sleep architecture. - Sleep hygiene should be reviewed to assess factors that may improve sleep quality. - Weight management and regular exercise should be initiated or continued. - Return to Sleep Center for re-evaluation after 4 weeks of therapy    RKara MeadMD Board Certified in [place]

## 2022-03-30 ENCOUNTER — Telehealth: Payer: Self-pay | Admitting: Pulmonary Disease

## 2022-03-30 DIAGNOSIS — G4733 Obstructive sleep apnea (adult) (pediatric): Secondary | ICD-10-CM

## 2022-03-30 NOTE — Telephone Encounter (Signed)
[employer] calling on behalf of PT . PT needs HST results and treatment plan. Pls call to advise. His # is [phone]

## 2022-03-31 NOTE — Telephone Encounter (Signed)
Please order CPAP machine with the following settings and equipment: Recommend a trial of Auto-CPAP 10-20 cm with medium full face mask along with humidificaiton.   He will need to follow up with sleep medicine team, preferably Dr. Elsworth Soho since he is familiar with the case.  Thanks, JD

## 2022-03-31 NOTE — Telephone Encounter (Signed)
Split night study performed 2/16. Dr. Elsworth Soho, please advise on results and treatment plan.

## 2022-03-31 NOTE — Telephone Encounter (Signed)
Rigoberto Noel, MD  You; Mitchell Lewis, MD1 hour ago (1:11 PM)    JD pt Severe OSA was noted with AHI of 100/h Recommend auto CPAP 10 to 20 cm    Dr. Erin Fulling, please advise if you want [place] to have pt be scheduled for an appt with one of our sleep doctors so they can manage his sleep apnea?

## 2022-04-01 NOTE — Telephone Encounter (Signed)
Cpap order set, spoke with patient appt scheduled with Elsworth Soho

## 2022-04-15 ENCOUNTER — Encounter (HOSPITAL_BASED_OUTPATIENT_CLINIC_OR_DEPARTMENT_OTHER): Payer: Self-pay | Admitting: Pulmonary Disease

## 2022-04-15 ENCOUNTER — Ambulatory Visit (HOSPITAL_BASED_OUTPATIENT_CLINIC_OR_DEPARTMENT_OTHER): Payer: Managed Care, Other (non HMO) | Admitting: Pulmonary Disease

## 2022-04-15 VITALS — BP 130/76 | HR 74 | Temp 98.1°F | Ht 68.0 in | Wt 301.1 lb

## 2022-04-15 DIAGNOSIS — R911 Solitary pulmonary nodule: Secondary | ICD-10-CM

## 2022-04-15 DIAGNOSIS — G4733 Obstructive sleep apnea (adult) (pediatric): Secondary | ICD-10-CM | POA: Diagnosis not present

## 2022-04-15 NOTE — Patient Instructions (Signed)
X Change auto CPAP to 12-20 cm  You are adjusting well

## 2022-04-15 NOTE — Assessment & Plan Note (Signed)
8 mm nodule appears stable over a 16-month.. 11-month follow-up CT chest has been scheduled

## 2022-04-15 NOTE — Progress Notes (Signed)
Subjective:    Patient ID: Mitchell Lewis., male    DOB: [birth year], 62 y.o.   MRN: [id number]  HPI  Chief Complaint  Patient presents with   Follow-up    Pt is switching providers due to recent sleep study.  Pt said he did receive a cpap machine about 6 days ago which he states is working well so far.   62 year old obese ex-smoker presents to establish care for OSA. He has seen my partner Dr. Erin Fulling for evaluation of COPD and PFTs were within normal limits He quit smoking in 2015. He has a 60 pack year history as he smoked 1.5 packs for 40 years   He reports loud snoring, excessive daytime somnolence and increased fatigue in the daytime.  He reports increased sleep pressure in the afternoons Epworth sleepiness score was 12 Bedtime around 10 PM, sleep latency about 30 minutes, sleeps on his back on his side with 2 pillows, ports 2-3 nocturnal awakenings and out of bed by 6 AM feeling tired with dryness of mouth and headaches.  He received his CPAP machine about a week ago and started on a fullface mask.  He already feels much improved.  He feels more rested in the daytime and less sleepy he feels like he has more energy  Significant tests/ events reviewed   NPSG 03/2022 AHI 100/h, low sat 66% Home Sleep Study 2019: Severe central/obstructive sleep apnea with AHI 70/hr  PFTs are within normal limit    LDCT chest 03/2022 stable 2mm nodule LDCT chest 08/2021 largest RML 63mm nodule   Past Medical History:  Diagnosis Date   Colon polyp    Fatty liver    History of colon polyps 2018   Prediabetes 09/15/2017   Sleep apnea    Subareolar mass of left breast 11/10/2020    Past Surgical History:  Procedure Laterality Date   COLONOSCOPY  04/27/2020   RIGHT/LEFT HEART CATH AND CORONARY ANGIOGRAPHY Bilateral 12/24/2019   Procedure: RIGHT/LEFT HEART CATH AND CORONARY ANGIOGRAPHY;  Surgeon: Nelva Bush, MD;  Location: [facility];  Service: Cardiovascular;  Laterality:  Bilateral;   TMJ ARTHROSCOPY  1993   VASECTOMY     WISDOM TOOTH EXTRACTION  03/17/2017    Allergies  Allergen Reactions   Other Shortness Of Breath and Itching    Chicken feathers   Sulfa Antibiotics Rash and Hives    Social History   Socioeconomic History   Marital status: Married    Spouse name: Not on file   Number of children: 2   Years of education: Not on file   Highest education level: Not on file  Occupational History   Occupation: full time Super  Tobacco Use   Smoking status: Former    Packs/day: 1.00    Years: 40.00    Additional pack years: 0.00    Total pack years: 40.00    Types: Cigarettes    Quit date: 05/15/2013    Years since quitting: 8.9   Smokeless tobacco: Never  Vaping Use   Vaping Use: Never used  Substance and Sexual Activity   Alcohol use: Yes    Alcohol/week: 1.0 standard drink of alcohol    Types: 1 Standard drinks or equivalent per week   Drug use: No   Sexual activity: Not on file  Other Topics Concern   Not on file  Social History Narrative   Not on file   Social Determinants of Health   Financial Resource Strain: Not on file  Food Insecurity: Not on file  Transportation Needs: Not on file  Physical Activity: Not on file  Stress: Not on file  Social Connections: Not on file  Intimate Partner Violence: Not on file     Review of Systems neg for any significant sore throat, dysphagia, itching, sneezing, nasal congestion or excess/ purulent secretions, fever, chills, sweats, unintended wt loss, pleuritic or exertional cp, hempoptysis, orthopnea pnd or change in chronic leg swelling. Also denies presyncope, palpitations, heartburn, abdominal pain, nausea, vomiting, diarrhea or change in bowel or urinary habits, dysuria,hematuria, rash, arthralgias, visual complaints, headache, numbness weakness or ataxia.     Objective:   Physical Exam  Gen. Pleasant, obese, in no distress ENT - no lesions, no post nasal drip Neck: No JVD, no  thyromegaly, no carotid bruits Lungs: no use of accessory muscles, no dullness to percussion, decreased without rales or rhonchi  Cardiovascular: Rhythm regular, heart sounds  normal, no murmurs or gallops, no peripheral edema Musculoskeletal: No deformities, no cyanosis or clubbing , no tremors       Assessment & Plan:

## 2022-04-15 NOTE — Assessment & Plan Note (Signed)
Severe OSA was noted during the study.  He actually required BiPAP 21/17 but we felt that auto CPAP may suffice.  He was placed on auto CPAP 10 to 20 cm with EPR of 3 CPAP download was reviewed which shows residual AHI of 32/hour with average pressure of 15 and maximum pressure of 16 cm.  Clinically he is tolerating well and seems somewhat improved but I am hoping we can do better with the residual events. Interval increase auto CPAP settings slightly to 12 to 20 cm We will reassess download in 2 months and if there is still residual events present then we will increase minimum pressure some more. He has a moderate leak on his machine but has been able to tolerate it well in the first week which is a good prognostic sign. We discussed care of the machine  Weight loss encouraged, compliance with goal of at least 4-6 hrs every night is the expectation. Advised against medications with sedative side effects Cautioned against driving when sleepy - understanding that sleepiness will vary on a day to day basis

## 2022-06-10 ENCOUNTER — Ambulatory Visit (HOSPITAL_BASED_OUTPATIENT_CLINIC_OR_DEPARTMENT_OTHER): Payer: Managed Care, Other (non HMO) | Admitting: Pulmonary Disease

## 2022-06-13 ENCOUNTER — Other Ambulatory Visit: Payer: Self-pay | Admitting: Gastroenterology

## 2022-06-13 DIAGNOSIS — K7581 Nonalcoholic steatohepatitis (NASH): Secondary | ICD-10-CM

## 2022-06-20 ENCOUNTER — Ambulatory Visit: Payer: Managed Care, Other (non HMO) | Admitting: Nurse Practitioner

## 2022-06-20 ENCOUNTER — Ambulatory Visit: Payer: Managed Care, Other (non HMO) | Admitting: Primary Care

## 2022-06-20 ENCOUNTER — Encounter: Payer: Self-pay | Admitting: Primary Care

## 2022-06-20 ENCOUNTER — Encounter: Payer: Self-pay | Admitting: Nurse Practitioner

## 2022-06-20 VITALS — BP 134/82 | HR 99 | Temp 98.2°F | Ht 68.0 in | Wt 303.4 lb

## 2022-06-20 VITALS — BP 126/84 | HR 79 | Temp 98.3°F | Resp 16 | Ht 68.0 in | Wt 301.5 lb

## 2022-06-20 DIAGNOSIS — E1165 Type 2 diabetes mellitus with hyperglycemia: Secondary | ICD-10-CM

## 2022-06-20 DIAGNOSIS — M5442 Lumbago with sciatica, left side: Secondary | ICD-10-CM

## 2022-06-20 DIAGNOSIS — G4733 Obstructive sleep apnea (adult) (pediatric): Secondary | ICD-10-CM

## 2022-06-20 DIAGNOSIS — G8929 Other chronic pain: Secondary | ICD-10-CM

## 2022-06-20 DIAGNOSIS — K760 Fatty (change of) liver, not elsewhere classified: Secondary | ICD-10-CM

## 2022-06-20 DIAGNOSIS — R911 Solitary pulmonary nodule: Secondary | ICD-10-CM

## 2022-06-20 DIAGNOSIS — Z7985 Long-term (current) use of injectable non-insulin antidiabetic drugs: Secondary | ICD-10-CM

## 2022-06-20 LAB — POCT GLYCOSYLATED HEMOGLOBIN (HGB A1C): Hemoglobin A1C: 7.3 % — AB (ref 4.0–5.6)

## 2022-06-20 MED ORDER — TIRZEPATIDE 2.5 MG/0.5ML ~~LOC~~ SOAJ: 2.5000 mg | SUBCUTANEOUS | 0 refills | Status: DC

## 2022-06-20 NOTE — Patient Instructions (Signed)
New pressure settings look better You are having less residual apneas Try cpap pillow for side sleepers  If still having airleaks, reach out to medical supply store and go in for mask re-fitting Continue to wear CPAP nightly 4-6 hours   Follow up 3 months with Dr. Vassie Loll ([facility])   CPAP and BIPAP Information CPAP and BIPAP are methods that use air pressure to keep your airways open and to help you breathe well. CPAP and BIPAP use different amounts of pressure. Your health care provider will tell you whether CPAP or BIPAP would be more helpful for you. CPAP stands for "continuous positive airway pressure." With CPAP, the amount of pressure stays the same while you breathe in (inhale) and out (exhale). BIPAP stands for "bi-level positive airway pressure." With BIPAP, the amount of pressure will be higher when you inhale and lower when you exhale. This allows you to take larger breaths. CPAP or BIPAP may be used in the hospital, or your health care provider may want you to use it at home. You may need to have a sleep study before your health care provider can order a machine for you to use at home. What are the advantages? CPAP or BIPAP can be helpful if you have: Sleep apnea. Chronic obstructive pulmonary disease (COPD). Heart failure. Medical conditions that cause muscle weakness, including muscular dystrophy or amyotrophic lateral sclerosis (ALS). Other problems that cause breathing to be shallow, weak, abnormal, or difficult. CPAP and BIPAP are most commonly used for obstructive sleep apnea (OSA) to keep the airways from collapsing when the muscles relax during sleep. What are the risks? Generally, this is a safe treatment. However, problems may occur, including: Irritated skin or skin sores if the mask does not fit properly. Dry or stuffy nose or nosebleeds. Dry mouth. Feeling gassy or bloated. Sinus or lung infection if the equipment is not cleaned properly. When should CPAP or  BIPAP be used? In most cases, the mask only needs to be worn during sleep. Generally, the mask needs to be worn throughout the night and during any daytime naps. People with certain medical conditions may also need to wear the mask at other times, such as when they are awake. Follow instructions from your health care provider about when to use the machine. What happens during CPAP or BIPAP?  Both CPAP and BIPAP are provided by a small machine with a flexible plastic tube that attaches to a plastic mask that you wear. Air is blown through the mask into your nose or mouth. The amount of pressure that is used to blow the air can be adjusted on the machine. Your health care provider will set the pressure setting and help you find the best mask for you. Tips for using the mask Because the mask needs to be snug, some people feel trapped or closed-in (claustrophobic) when first using the mask. If you feel this way, you may need to get used to the mask. One way to do this is to hold the mask loosely over your nose or mouth and then gradually apply the mask more snugly. You can also gradually increase the amount of time that you use the mask. Masks are available in various types and sizes. If your mask does not fit well, talk with your health care provider about getting a different one. Some common types of masks include: Full face masks, which fit over the mouth and nose. Nasal masks, which fit over the nose. Nasal pillow or prong masks,  which fit into the nostrils. If you are using a mask that fits over your nose and you tend to breathe through your mouth, a chin strap may be applied to help keep your mouth closed. Use a skin barrier to protect your skin as told by your health care provider. Some CPAP and BIPAP machines have alarms that may sound if the mask comes off or develops a leak. If you have trouble with the mask, it is very important that you talk with your health care provider about finding a way to  make the mask easier to tolerate. Do not stop using the mask. There could be a negative impact on your health if you stop using the mask. Tips for using the machine Place your CPAP or BIPAP machine on a secure table or stand near an electrical outlet. Know where the on/off switch is on the machine. Follow instructions from your health care provider about how to set the pressure on your machine and when you should use it. Do not eat or drink while the CPAP or BIPAP machine is on. Food or fluids could get pushed into your lungs by the pressure of the CPAP or BIPAP. For home use, CPAP and BIPAP machines can be rented or purchased through home health care companies. Many different brands of machines are available. Renting a machine before purchasing may help you find out which particular machine works well for you. Your health insurance company may also decide which machine you may get. Keep the CPAP or BIPAP machine and attachments clean. Ask your health care provider for specific instructions. Check the humidifier if you have a dry stuffy nose or nosebleeds. Make sure it is working correctly. Follow these instructions at home: Take over-the-counter and prescription medicines only as told by your health care provider. Ask if you can take sinus medicine if your sinuses are blocked. Do not use any products that contain nicotine or tobacco. These products include cigarettes, chewing tobacco, and vaping devices, such as e-cigarettes. If you need help quitting, ask your health care provider. Keep all follow-up visits. This is important. Contact a health care provider if: You have redness or pressure sores on your head, face, mouth, or nose from the mask or head gear. You have trouble using the CPAP or BIPAP machine. You cannot tolerate wearing the CPAP or BIPAP mask. Someone tells you that you snore even when wearing your CPAP or BIPAP. Get help right away if: You have trouble breathing. You feel  confused. Summary CPAP and BIPAP are methods that use air pressure to keep your airways open and to help you breathe well. If you have trouble with the mask, it is very important that you talk with your health care provider about finding a way to make the mask easier to tolerate. Do not stop using the mask. There could be a negative impact to your health if you stop using the mask. Follow instructions from your health care provider about when to use the machine. This information is not intended to replace advice given to you by your health care provider. Make sure you discuss any questions you have with your health care provider. Document Revised: 08/26/2020 Document Reviewed: 12/27/2019 Elsevier Patient Education  2023 [employer].

## 2022-06-20 NOTE — Assessment & Plan Note (Signed)
Patient's A1c 7.3% today has tried metformin in the past but caused gynecomastia.  Will prescribe Mounjaro 2.5 mg once a week.  Patient denies family history of medullary thyroid cancer or multiple endocrine neoplasia syndrome type II.  Patient has several comorbidities to go along with his weight inclusive of diabetes, OSA, hepatic steatosis

## 2022-06-20 NOTE — Patient Instructions (Addendum)
Nice to see you today I have placed the order for the injection Scheduled your physical for next month when you are in town Follow up sooner if you need me

## 2022-06-20 NOTE — Progress Notes (Signed)
@Patient  ID: Mitchell Mans., male    DOB: 1960/07/12, 62 y.o.   MRN: [id number]  Chief Complaint  Patient presents with   Follow-up    Doing well with CPAP.  No daytime sleepiness.  Patient did increase the humidity on the CPAP machine.    Referring provider: Eden Emms, NP  HPI: 62 year old male, former smoker quit in 2015 (40-pack-year history).  Past medical history significant for severe obstructive sleep apnea, centrilobular emphysema, pulmonary nodule, hepatic steatosis, type 2 diabetes, malignant skin neoplasm, obesity.   06/20/2022 Patient presents today for OSA follow-up. Patient has severe OSA, he actually required BIPAP 21/17 but felt that auto CPAP would suffice. Download showed residual AHI 32/hour with an average pressure 15cm h20 (max pressure 16cm h20). During his last visit Dr. Vassie Loll his CPAP settings were increased to 12-20cm h20.   He is doing really well. Fatigue has significantly improved, he does not get as tired during the day time. He is sleeping well at night. He gets on average 5-6 hours of sleep a night, this is normal for him and he feels rested. He is having a lot of airleaks. He uses a full face mask. Mask often rides up. He sleeps on his side. He prefers not to use heated humidity in the summer.  We talked about trying a cpap pillow for side sleepers .  Airview download 05/18/22- 06/16/22 Usage 29/30 days used; 27 days (90%) > 4 hours Average usage 5 hours 55 mins Pressure 12-20cm h20 (13.3cm h20-95%) Airleaks 83L/min (95%) AHI 12.1  Allergies  Allergen Reactions   Other Shortness Of Breath and Itching    Chicken feathers   Sulfa Antibiotics Rash and Hives    Immunization History  Administered Date(s) Administered   Influenza-Unspecified 09/18/2017    Past Medical History:  Diagnosis Date   Colon polyp    Fatty liver    History of colon polyps 2018   Prediabetes 09/15/2017   Sleep apnea    Subareolar mass of left breast 11/10/2020     Tobacco History: Social History   Tobacco Use  Smoking Status Former   Packs/day: 1.00   Years: 40.00   Additional pack years: 0.00   Total pack years: 40.00   Types: Cigarettes   Quit date: 05/15/2013   Years since quitting: 9.1  Smokeless Tobacco Never   Counseling given: Not Answered   Outpatient Medications Prior to Visit  Medication Sig Dispense Refill   Accu-Chek Softclix Lancets lancets Use as instructed daily to check sugars. DX R73.03 100 each 12   albuterol (VENTOLIN HFA) 108 (90 Base) MCG/ACT inhaler Inhale 2 puffs into the lungs every 6 (six) hours as needed for wheezing or shortness of breath. 8 g 6   anastrozole (ARIMIDEX) 1 MG tablet Take 1 mg by mouth 3 (three) times a week.     Berberine Chloride (BERBERINE HCI PO) Take by mouth. 1 BID     Blood Glucose Monitoring Suppl (ACCU-CHEK GUIDE ME) w/Device KIT USE DAILY AS NEEDED TO CHECK SUGAR 1 kit 0   Cholecalciferol (DIALYVITE VITAMIN D 5000) 125 MCG (5000 UT) capsule Take 5,000 Units by mouth daily.     cyclobenzaprine (FLEXERIL) 10 MG tablet Take 10 mg by mouth 3 (three) times daily. As needed     diphenhydrAMINE (BENADRYL) 25 MG tablet Take 25 mg by mouth at bedtime as needed for sleep.     doxazosin (CARDURA) 2 MG tablet Take 1 tablet (2 mg total) by mouth  daily. 90 tablet 1   finasteride (PROSCAR) 5 MG tablet Take 5 mg by mouth daily.     glucose blood (ACCU-CHEK GUIDE) test strip Use as needed daily to check sugars. Dx R73.03 100 each 12   ibuprofen (ADVIL) 200 MG tablet Take 200-400 mg by mouth every 6 (six) hours as needed for headache or moderate pain.     NON FORMULARY MULLEN drops and Turmeric drops     Pediatric Multivitamins-Iron (FLINTSTONES COMPLETE PO) Take 1 tablet by mouth daily.     sertraline (ZOLOFT) 100 MG tablet TAKE 1 TABLET BY MOUTH EVERY DAY 90 tablet 3   testosterone cypionate (DEPOTESTOSTERONE CYPIONATE) 200 MG/ML injection 4 mg weekly per patient     tirzepatide (MOUNJARO) 2.5  MG/0.5ML Pen Inject 2.5 mg into the skin once a week. 2 mL 0   traMADol (ULTRAM) 50 MG tablet Take 1 tablet (50 mg total) by mouth every 8 (eight) hours as needed for moderate pain or severe pain. 20 tablet 0   XYOSTED 75 MG/0.5ML SOAJ Inject into the skin.     zinc gluconate 50 MG tablet Take 50 mg by mouth daily.     No facility-administered medications prior to visit.      Review of Systems  Review of Systems  Constitutional:  Negative for fatigue.  Respiratory: Negative.    Cardiovascular: Negative.   Psychiatric/Behavioral:  Negative for sleep disturbance.      Physical Exam  BP 134/82 (BP Location: Right Arm, Patient Position: Sitting, Cuff Size: Large)   Pulse 99   Temp 98.2 F (36.8 C) (Oral)   Ht 5\' 8"  (1.727 m)   Wt (!) 303 lb 6.4 oz (137.6 kg)   SpO2 95%   BMI 46.13 kg/m  Physical Exam Constitutional:      Appearance: Normal appearance. He is obese.  HENT:     Head: Normocephalic and atraumatic.  Cardiovascular:     Rate and Rhythm: Normal rate and regular rhythm.  Pulmonary:     Effort: Pulmonary effort is normal.     Breath sounds: Normal breath sounds.  Musculoskeletal:        General: Normal range of motion.  Skin:    General: Skin is warm and dry.  Neurological:     General: No focal deficit present.     Mental Status: He is alert and oriented to person, place, and time. Mental status is at baseline.  Psychiatric:        Mood and Affect: Mood normal.        Behavior: Behavior normal.        Thought Content: Thought content normal.        Judgment: Judgment normal.      Lab Results:  CBC    Component Value Date/Time   WBC 7.9 12/27/2021 0855   RBC 4.99 12/27/2021 0855   HGB 16.2 12/27/2021 0855   HGB 17.4 12/16/2019 0924   HCT 47.9 12/27/2021 0855   HCT 51.8 (H) 12/16/2019 0924   PLT 227.0 12/27/2021 0855   PLT 270 12/16/2019 0924   MCV 96.1 12/27/2021 0855   MCV 97 12/16/2019 0924   MCH 32.5 12/16/2019 0924   MCHC 33.8 12/27/2021  0855   RDW 14.0 12/27/2021 0855   RDW 12.2 12/16/2019 0924   LYMPHSABS 2.7 03/01/2017 1001   MONOABS 1.0 03/01/2017 1001   EOSABS 0.2 03/01/2017 1001   BASOSABS 0.1 03/01/2017 1001    BMET    Component Value Date/Time   NA  137 12/27/2021 0855   NA 137 12/16/2019 0924   K 4.5 12/27/2021 0855   CL 104 12/27/2021 0855   CO2 27 12/27/2021 0855   GLUCOSE 119 (H) 12/27/2021 0855   BUN 21 12/27/2021 0855   BUN 17 12/16/2019 0924   CREATININE 1.02 12/27/2021 0855   CREATININE 1.07 09/15/2017 1631   CALCIUM 9.2 12/27/2021 0855   GFRNONAA 88 12/16/2019 0924   GFRAA 102 12/16/2019 0924    BNP No results found for: "BNP"  ProBNP No results found for: "PROBNP"  Imaging: No results found.   Assessment & Plan:   Severe obstructive sleep apnea-hypopnea syndrome - Patient 90% compliant with CPAP use greater than 4 hours over the last 30 days.  He reports significant improvement in daytime sleepiness with PAP therapy.  Current pressure setting 12-20 cm H2O (13.3cm h20-95%).  Residual apneas improved with pressure increase, currently his average is AHI 12.1/hour (previously 32/hour). He is having large amount of airleaks. Recommend trying CPAP pillow for side sleepers and re-adjusting head gear, ordered for mask fitting with DME. No pressure changes today. Advised patient continue to wear CPAP nightly 4-6 hours. Encourage weight loss efforts. FU in 3 months or sooner.   Pulmonary nodule - He has an 8mm nodule RUL that appears stable over the last 6 months, due for repeat LDCT imaging in August 2024. Moderate paraseptal and centrilobular emphysema.   Glenford Bayley, NP 06/20/2022

## 2022-06-20 NOTE — Assessment & Plan Note (Signed)
-   Patient 90% compliant with CPAP use greater than 4 hours over the last 30 days.  He reports significant improvement in daytime sleepiness with PAP therapy.  Current pressure setting 12-20 cm H2O (13.3cm h20-95%).  Residual apneas improved with pressure increase, currently his average is AHI 12.1/hour (previously 32/hour). He is having large amount of airleaks. Recommend trying CPAP pillow for side sleepers and re-adjusting head gear, ordered for mask fitting with DME. No pressure changes today. Advised patient continue to wear CPAP nightly 4-6 hours. Encourage weight loss efforts. FU in 3 months or sooner.

## 2022-06-20 NOTE — Assessment & Plan Note (Signed)
-   He has an 8mm nodule RUL that appears stable over the last 6 months, due for repeat LDCT imaging in August 2024. Moderate paraseptal and centrilobular emphysema.

## 2022-06-20 NOTE — Assessment & Plan Note (Signed)
Patient is followed GI and recently started on CPAP approximately 2 months ago.  Continue

## 2022-06-20 NOTE — Progress Notes (Signed)
Established Patient Office Visit  Subjective   Patient ID: Mitchell Feltz., male    DOB: Oct 07, 1960  Age: 62 y.o. MRN: [id number]  Chief Complaint  Patient presents with   Obesity    Wants medication for weight loss    HPI  Obesity: patinet is interested in weight loss medications. He does have a history of DM2, OSA, HLD  States that he recently started on his cpap therapy. States that he has gained weight since he started on the cpap He has looked at trizepatide.   Patient has tried metformin in the past but cause gynecomastia and was taken off the medication   States that he has issues with his back and has seen specialist in the past and done injections. States that he will walk. 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   Diet: states that he is doing 3 meals a day. States that he has tried fasting in the past. States that he got some results. If he is at home he can do it but at work he cannot do it.      Review of Systems  Constitutional:  Negative for chills and fever.  Respiratory:  Negative for shortness of breath.   Cardiovascular:  Negative for chest pain.  Neurological:  Negative for headaches.      Objective:     BP 126/84   Pulse 79   Temp 98.3 F (36.8 C)   Resp 16   Ht 5\' 8"  (1.727 m)   Wt (!) 301 lb 8 oz (136.8 kg)   SpO2 97%   BMI 45.84 kg/m  BP Readings from Last 3 Encounters:  06/20/22 126/84  04/15/22 130/76  12/27/21 130/80   Wt Readings from Last 3 Encounters:  06/20/22 (!) 301 lb 8 oz (136.8 kg)  04/15/22 (!) 301 lb 2.4 oz (136.6 kg)  03/18/22 290 lb (131.5 kg)      Physical Exam Vitals and nursing note reviewed.  Constitutional:      Appearance: Normal appearance.  Cardiovascular:     Rate and Rhythm: Normal rate and regular rhythm.     Heart sounds: Normal heart sounds.  Pulmonary:     Effort: Pulmonary effort is normal.     Breath sounds: Normal breath sounds.   Neurological:     Mental Status: He is alert.      Results for orders placed or performed in visit on 06/20/22  POCT glycosylated hemoglobin (Hb A1C)  Result Value Ref Range   Hemoglobin A1C 7.3 (A) 4.0 - 5.6 %   HbA1c POC (<> result, manual entry)     HbA1c, POC (prediabetic range)     HbA1c, POC (controlled diabetic range)        The ASCVD Risk score (Arnett DK, et al., 2019) failed to calculate for the following reasons:   The valid total cholesterol range is 130 to 320 mg/dL    Assessment & Plan:   Problem List Items Addressed This Visit       Respiratory   Severe obstructive sleep apnea-hypopnea syndrome    Patient is followed GI and recently started on CPAP approximately 2 months ago.  Continue        Digestive   Hepatic steatosis     Endocrine   Type 2 diabetes mellitus with hyperglycemia, without long-term current use of insulin (HCC) - Primary    Patient's A1c 7.3% today has tried  metformin in the past but caused gynecomastia.  Will prescribe Mounjaro 2.5 mg once a week.  Patient denies family history of medullary thyroid cancer or multiple endocrine neoplasia syndrome type II.  Patient has several comorbidities to go along with his weight inclusive of diabetes, OSA, hepatic steatosis      Relevant Medications   tirzepatide [facility]) 2.5 MG/0.5ML Pen   Other Relevant Orders   POCT glycosylated hemoglobin (Hb A1C) (Completed)     Nervous and Auditory   Chronic midline low back pain with left-sided sciatica    Return in about 4 weeks (around 07/18/2022) for CPE and Labs.    Audria Nine, NP

## 2022-06-21 NOTE — Addendum Note (Signed)
Addended by: Glenford Bayley on: 06/21/2022 02:13 PM   Modules accepted: Orders

## 2022-06-21 NOTE — Progress Notes (Signed)
Spoke with Dr. Vassie Loll whom recommended tightening pressure to range 12-15 cm. Order has been placed.

## 2022-06-30 ENCOUNTER — Telehealth: Payer: Self-pay

## 2022-06-30 NOTE — Telephone Encounter (Signed)
PA approved.   CaseId:[id number];Status:Approved;Review Type:Prior Auth;Coverage Start Date:06/30/2022;Coverage End Date:06/30/2023; Authorization Expiration Date: 06/29/2023

## 2022-06-30 NOTE — Telephone Encounter (Signed)
PA initiated via Covermymeds; KEY: [id number]. Awaiting determination.

## 2022-06-30 NOTE — Telephone Encounter (Signed)
Called and informed pt of this information. 

## 2022-07-21 ENCOUNTER — Ambulatory Visit: Payer: Managed Care, Other (non HMO) | Admitting: Nurse Practitioner

## 2022-07-21 ENCOUNTER — Other Ambulatory Visit: Payer: Managed Care, Other (non HMO)

## 2022-07-29 ENCOUNTER — Other Ambulatory Visit: Payer: Self-pay | Admitting: Nurse Practitioner

## 2022-07-29 DIAGNOSIS — E1165 Type 2 diabetes mellitus with hyperglycemia: Secondary | ICD-10-CM

## 2022-07-29 MED ORDER — TIRZEPATIDE 5 MG/0.5ML ~~LOC~~ SOAJ: 5.0000 mg | SUBCUTANEOUS | 0 refills | Status: DC

## 2022-07-29 NOTE — Telephone Encounter (Signed)
Called pt and he stated that he has done ok with the medication and he has an appt to talk to you on 08/01/2022

## 2022-07-29 NOTE — Telephone Encounter (Signed)
Can we make sure patient is tolerating the mounjaro medication well. If so I will titrate him up to the next dose of 5 mg

## 2022-08-01 ENCOUNTER — Encounter: Payer: Self-pay | Admitting: Nurse Practitioner

## 2022-08-01 ENCOUNTER — Ambulatory Visit (INDEPENDENT_AMBULATORY_CARE_PROVIDER_SITE_OTHER): Payer: Managed Care, Other (non HMO) | Admitting: Nurse Practitioner

## 2022-08-01 VITALS — BP 130/82 | HR 73 | Temp 98.1°F | Ht 69.0 in | Wt 296.0 lb

## 2022-08-01 DIAGNOSIS — Z0000 Encounter for general adult medical examination without abnormal findings: Secondary | ICD-10-CM | POA: Diagnosis not present

## 2022-08-01 DIAGNOSIS — Z7985 Long-term (current) use of injectable non-insulin antidiabetic drugs: Secondary | ICD-10-CM

## 2022-08-01 DIAGNOSIS — E1165 Type 2 diabetes mellitus with hyperglycemia: Secondary | ICD-10-CM

## 2022-08-01 DIAGNOSIS — Z114 Encounter for screening for human immunodeficiency virus [HIV]: Secondary | ICD-10-CM

## 2022-08-01 DIAGNOSIS — G8929 Other chronic pain: Secondary | ICD-10-CM

## 2022-08-01 DIAGNOSIS — Z87891 Personal history of nicotine dependence: Secondary | ICD-10-CM | POA: Diagnosis not present

## 2022-08-01 DIAGNOSIS — Z1159 Encounter for screening for other viral diseases: Secondary | ICD-10-CM

## 2022-08-01 DIAGNOSIS — Z6841 Body Mass Index (BMI) 40.0 and over, adult: Secondary | ICD-10-CM

## 2022-08-01 DIAGNOSIS — Z13220 Encounter for screening for lipoid disorders: Secondary | ICD-10-CM | POA: Diagnosis not present

## 2022-08-01 DIAGNOSIS — F419 Anxiety disorder, unspecified: Secondary | ICD-10-CM

## 2022-08-01 DIAGNOSIS — F32A Depression, unspecified: Secondary | ICD-10-CM

## 2022-08-01 DIAGNOSIS — G4733 Obstructive sleep apnea (adult) (pediatric): Secondary | ICD-10-CM

## 2022-08-01 DIAGNOSIS — Z125 Encounter for screening for malignant neoplasm of prostate: Secondary | ICD-10-CM

## 2022-08-01 DIAGNOSIS — R351 Nocturia: Secondary | ICD-10-CM

## 2022-08-01 DIAGNOSIS — J432 Centrilobular emphysema: Secondary | ICD-10-CM

## 2022-08-01 LAB — COMPREHENSIVE METABOLIC PANEL
ALT: 71 U/L — ABNORMAL HIGH (ref 0–53)
AST: 43 U/L — ABNORMAL HIGH (ref 0–37)
Albumin: 4.4 g/dL (ref 3.5–5.2)
Alkaline Phosphatase: 72 U/L (ref 39–117)
BUN: 17 mg/dL (ref 6–23)
CO2: 30 mEq/L (ref 19–32)
Calcium: 9.9 mg/dL (ref 8.4–10.5)
Chloride: 103 mEq/L (ref 96–112)
Creatinine, Ser: 0.94 mg/dL (ref 0.40–1.50)
GFR: 85.98 mL/min (ref >60.00)
Glucose, Bld: 112 mg/dL — ABNORMAL HIGH (ref 70–99)
Potassium: 5 mEq/L (ref 3.5–5.1)
Sodium: 140 mEq/L (ref 135–145)
Total Bilirubin: 0.6 mg/dL (ref 0.2–1.2)
Total Protein: 7 g/dL (ref 6.0–8.3)

## 2022-08-01 LAB — MICROALBUMIN / CREATININE URINE RATIO
Creatinine,U: 110.1 mg/dL
Microalb Creat Ratio: 0.7 mg/g (ref 0.0–30.0)
Microalb, Ur: 0.8 mg/dL (ref 0.0–1.9)

## 2022-08-01 LAB — CBC
HCT: 47 % (ref 39.0–52.0)
Hemoglobin: 15.8 g/dL (ref 13.0–17.0)
MCHC: 33.7 g/dL (ref 30.0–36.0)
MCV: 97.8 fl (ref 78.0–100.0)
Platelets: 242 10*3/uL (ref 150.0–400.0)
RBC: 4.8 Mil/uL (ref 4.22–5.81)
RDW: 14.3 % (ref 11.5–15.5)
WBC: 7.5 10*3/uL (ref 4.0–10.5)

## 2022-08-01 LAB — PSA: PSA: 1.99 ng/mL (ref 0.10–4.00)

## 2022-08-01 LAB — LIPID PANEL
Cholesterol: 136 mg/dL (ref 0–200)
HDL: 38.9 mg/dL — ABNORMAL LOW (ref >39.00)
LDL Cholesterol: 75 mg/dL (ref 0–99)
NonHDL: 97.29
Total CHOL/HDL Ratio: 4
Triglycerides: 111 mg/dL (ref 0.0–149.0)
VLDL: 22.2 mg/dL (ref 0.0–40.0)

## 2022-08-01 LAB — URINALYSIS, MICROSCOPIC ONLY: RBC / HPF: NONE SEEN (ref 0–?)

## 2022-08-01 LAB — TSH: TSH: 2.44 u[IU]/mL (ref 0.35–5.50)

## 2022-08-01 NOTE — Assessment & Plan Note (Signed)
Patient is followed by pulmonology does have a CPAP with good adherence per patient report and good result.  Continue using CPAP as prescribed follow-up with pulmonology as recommended

## 2022-08-01 NOTE — Assessment & Plan Note (Signed)
Not currently on any medications states he had improvement with CPAP.  He continues a CPAP therapy as recommended

## 2022-08-01 NOTE — Assessment & Plan Note (Signed)
Continue working lifestyle modifications.

## 2022-08-01 NOTE — Assessment & Plan Note (Signed)
Patient currently maintained on sertraline 100 mg daily.  Tolerating medication well.  Patient denies HI/SI/AVH.

## 2022-08-01 NOTE — Assessment & Plan Note (Signed)
Is followed by pulmonology.  No inhalers currently is per the LDCT program reviewed most recent 1

## 2022-08-01 NOTE — Patient Instructions (Signed)
Nice to see you today I will be in touch with the labs once I have reviewed them Follow up with em in 2 months, sooner if you need me

## 2022-08-01 NOTE — Assessment & Plan Note (Signed)
Check urine microscopy for microscopic hematuria in setting of former tobacco use

## 2022-08-01 NOTE — Assessment & Plan Note (Signed)
Discussed age-appropriate immunizations and screening exams.  Patient is up-to-date on all age-appropriate vaccinations.  Patient declined Prevnar 20 today information given at discharge patient is up-to-date on CRC screening will do PSA for prostate cancer screening.  Patient is up-to-date on LDCT.  Patient was given information at discharge about preventative healthcare maintenance with anticipatory guidance.

## 2022-08-01 NOTE — Assessment & Plan Note (Signed)
Patient currently maintained on Mounjaro 5 mg once a week recently finishing on his 2.5 mg has had an appreciable 7 pound weight loss tolerating medication well.  Continue checking glucose at home continue taking [facility] as prescribed

## 2022-08-01 NOTE — Assessment & Plan Note (Signed)
Has been evaluated by sports medicine in the past.  Patient no longer taking muscle relaxer or pain medication states his back is felt better even with a modest amount of weight loss

## 2022-08-01 NOTE — Progress Notes (Signed)
Established Patient Office Visit  Subjective   Patient ID: Mitchell Bihl., male    DOB: March 21, 1960  Age: 63 y.o. MRN: [id number]  Chief Complaint  Patient presents with   Annual Exam    Non fasting, pt would like to discuss dosage in medications.     HPI  for complete physical and follow up of chronic conditions.  MDD: States that he is doing well on the zoloft   DM2: currenlty on mounjaro and recently titrated up to 5mg  and tolerating it well. Statea that he has been checking it at home 100-125. Once a week   Nocturia: state that he is not on any medications. State that he will get up once a night   OSA: states that he is using the CPAP  and tolerating it well  TRT: not currently on   Immunizations: -Tetanus: Completed in within 10 years -Influenza: out of season -Shingles: has done one of the injections and had shingles in the past  -Pneumonia:  refused   Diet: Fair diet. 3 meals a day without snacking. States that he does drink water  Exercise: No regular exercise. Work   Eye exam: Needs updating  Dental exam:  has plates   Colonoscopy: Dr. Willeen Cass [place] repeat in 2027 Lung Cancer Screening: Completed in 03/08/2022 repeat scheduled for 09/07/2022  PSA: Due  Sleep: states that he will go to bed around 8-10 and will get up around 430-445. Wears CPAP and feels rested       Review of Systems  Constitutional:  Negative for chills and fever.  Respiratory:  Negative for shortness of breath.   Cardiovascular:  Negative for chest pain and leg swelling.  Gastrointestinal:  Negative for abdominal pain, blood in stool, constipation, diarrhea, nausea and vomiting.  Genitourinary:  Negative for dysuria and hematuria.  Neurological:  Negative for tingling and headaches.  Psychiatric/Behavioral:  Negative for hallucinations and suicidal ideas.       Objective:     BP 130/82   Pulse 73   Temp 98.1 F (36.7 C) (Temporal)   Ht 5\' 9"  (1.753 m)   Wt 296 lb  (134.3 kg)   SpO2 96%   BMI 43.71 kg/m  BP Readings from Last 3 Encounters:  08/01/22 130/82  06/20/22 134/82  06/20/22 126/84   Wt Readings from Last 3 Encounters:  08/01/22 296 lb (134.3 kg)  06/20/22 (!) 303 lb 6.4 oz (137.6 kg)  06/20/22 (!) 301 lb 8 oz (136.8 kg)      Physical Exam Vitals and nursing note reviewed.  Constitutional:      Appearance: Normal appearance.  HENT:     Right Ear: Tympanic membrane, ear canal and external ear normal.     Left Ear: Tympanic membrane, ear canal and external ear normal.     Mouth/Throat:     Mouth: Mucous membranes are moist.     Pharynx: Oropharynx is clear.  Eyes:     Extraocular Movements: Extraocular movements intact.     Pupils: Pupils are equal, round, and reactive to light.  Cardiovascular:     Rate and Rhythm: Normal rate and regular rhythm.     Pulses: Normal pulses.     Heart sounds: Normal heart sounds.  Pulmonary:     Effort: Pulmonary effort is normal.     Breath sounds: Normal breath sounds.  Abdominal:     General: Bowel sounds are normal. There is no distension.     Palpations: There is no mass.  Tenderness: There is no abdominal tenderness.     Hernia: No hernia is present.  Musculoskeletal:     Right lower leg: No edema.     Left lower leg: No edema.  Lymphadenopathy:     Cervical: No cervical adenopathy.  Skin:    General: Skin is warm.  Neurological:     General: No focal deficit present.     Mental Status: He is alert.     Comments: Bilateral upper and lower extremity strength 5/5  Psychiatric:        Mood and Affect: Mood normal.        Behavior: Behavior normal.        Thought Content: Thought content normal.        Judgment: Judgment normal.      No results found for any visits on 08/01/22.    The ASCVD Risk score (Arnett DK, et al., 2019) failed to calculate for the following reasons:   The valid total cholesterol range is 130 to 320 mg/dL    Assessment & Plan:   Problem List  Items Addressed This Visit       Respiratory   Severe obstructive sleep apnea-hypopnea syndrome    Patient is followed by pulmonology does have a CPAP with good adherence per patient report and good result.  Continue using CPAP as prescribed follow-up with pulmonology as recommended      Centrilobular emphysema (HCC)    Is followed by pulmonology.  No inhalers currently is per the LDCT program reviewed most recent 1        Endocrine   Type 2 diabetes mellitus with hyperglycemia, without long-term current use of insulin (HCC)    Patient currently maintained on Mounjaro 5 mg once a week recently finishing on his 2.5 mg has had an appreciable 7 pound weight loss tolerating medication well.  Continue checking glucose at home continue taking Mounjaro as prescribed      Relevant Orders   Microalbumin / creatinine urine ratio   Lipid panel     Nervous and Auditory   Chronic midline low back pain with left-sided sciatica    Has been evaluated by sports medicine in the past.  Patient no longer taking muscle relaxer or pain medication states his back is felt better even with a modest amount of weight loss        Other   Nocturia    Not currently on any medications states he had improvement with CPAP.  He continues a CPAP therapy as recommended      Morbid obesity (HCC)    Continue working lifestyle modifications.      Anxiety and depression    Patient currently maintained on sertraline 100 mg daily.  Tolerating medication well.  Patient denies HI/SI/AVH.      Preventative health care - Primary    Discussed age-appropriate immunizations and screening exams.  Patient is up-to-date on all age-appropriate vaccinations.  Patient declined Prevnar 20 today information given at discharge patient is up-to-date on CRC screening will do PSA for prostate cancer screening.  Patient is up-to-date on LDCT.  Patient was given information at discharge about preventative healthcare maintenance with  anticipatory guidance.      Relevant Orders   CBC   Comprehensive metabolic panel   TSH   Former tobacco use    Check urine microscopy for microscopic hematuria in setting of former tobacco use      Relevant Orders   Urine Microscopic   Other Visit Diagnoses  Screening for prostate cancer       Relevant Orders   PSA   Screening for HIV (human immunodeficiency virus)       Relevant Orders   HIV Antibody (routine testing w rflx)   Encounter for hepatitis C screening test for low risk patient       Relevant Orders   Hepatitis C antibody       Return in about 2 months (around 10/02/2022) for DM recheck.    Audria Nine, NP

## 2022-08-02 ENCOUNTER — Ambulatory Visit
Admission: RE | Admit: 2022-08-02 | Discharge: 2022-08-02 | Disposition: A | Discharge status: Home / Self Care | Payer: Managed Care, Other (non HMO) | Source: Ambulatory Visit | Attending: Gastroenterology | Admitting: Gastroenterology

## 2022-08-02 DIAGNOSIS — K7581 Nonalcoholic steatohepatitis (NASH): Secondary | ICD-10-CM

## 2022-08-02 LAB — HEPATITIS C ANTIBODY: Hepatitis C Ab: NONREACTIVE

## 2022-08-02 LAB — HIV ANTIBODY (ROUTINE TESTING W REFLEX): HIV 1&2 Ab, 4th Generation: NONREACTIVE

## 2022-08-29 ENCOUNTER — Other Ambulatory Visit: Payer: Self-pay | Admitting: Nurse Practitioner

## 2022-08-29 DIAGNOSIS — E1165 Type 2 diabetes mellitus with hyperglycemia: Secondary | ICD-10-CM

## 2022-08-29 NOTE — Telephone Encounter (Signed)
LAST APPOINTMENT DATE: 08/01/22   NEXT APPOINTMENT DATE: 09/12/2022    LAST REFILL: 07/29/22  QTY: 2 ml no rf      .

## 2022-09-07 ENCOUNTER — Ambulatory Visit
Admission: RE | Admit: 2022-09-07 | Discharge: 2022-09-07 | Disposition: A | Discharge status: Home / Self Care | Payer: Managed Care, Other (non HMO) | Source: Ambulatory Visit | Attending: Acute Care | Admitting: Acute Care

## 2022-09-07 DIAGNOSIS — R911 Solitary pulmonary nodule: Secondary | ICD-10-CM

## 2022-09-07 DIAGNOSIS — Z87891 Personal history of nicotine dependence: Secondary | ICD-10-CM

## 2022-09-12 ENCOUNTER — Encounter: Payer: Managed Care, Other (non HMO) | Admitting: Nurse Practitioner

## 2022-09-12 ENCOUNTER — Encounter: Payer: Self-pay | Admitting: Nurse Practitioner

## 2022-09-12 NOTE — Progress Notes (Unsigned)
error 

## 2022-09-15 ENCOUNTER — Encounter (INDEPENDENT_AMBULATORY_CARE_PROVIDER_SITE_OTHER): Payer: Self-pay

## 2022-09-19 ENCOUNTER — Ambulatory Visit (HOSPITAL_BASED_OUTPATIENT_CLINIC_OR_DEPARTMENT_OTHER): Payer: Managed Care, Other (non HMO) | Admitting: Pulmonary Disease

## 2022-09-19 ENCOUNTER — Encounter (HOSPITAL_BASED_OUTPATIENT_CLINIC_OR_DEPARTMENT_OTHER): Payer: Self-pay | Admitting: Pulmonary Disease

## 2022-09-19 VITALS — BP 128/80 | HR 89 | Resp 21 | Ht 69.0 in | Wt 291.0 lb

## 2022-09-19 DIAGNOSIS — G4733 Obstructive sleep apnea (adult) (pediatric): Secondary | ICD-10-CM | POA: Diagnosis not present

## 2022-09-19 DIAGNOSIS — R911 Solitary pulmonary nodule: Secondary | ICD-10-CM

## 2022-09-19 NOTE — Progress Notes (Signed)
   Subjective:    Patient ID: Mitchell Mans., male    DOB: [birth year], 62 y.o.   MRN: [id number]  HPI  62 year old heavy ex-smoker for follow-up of OSA He quit smoking in 2015. He has a 60 pack year history as he smoked 1.5 packs for 40 years   He actually required BiPAP 21/17 but we felt that auto CPAP may suffice.   Chief Complaint  Patient presents with   Follow-up    3 month follow up , ct scan discuss cpap machine , receive some of parts . The mask isn't sealing     He states that his breathing is okay. He has settled down with a fullface mask.  He has been unable to get a replacement for the swivel part that attaches the hose to the mask and because of the air leak, he has had nocturnal awakenings. Overall on nights when he is able to use the machine he feels much better rested. DME is [employer]. We reviewed 50-month follow-up CT chest which showed stable nodule    Significant tests/ events reviewed     NPSG 03/2022 AHI 100/h, low sat 66% - Central apneas emerged on CPAP 16 cm & he could not tolerate high pressure. Hence switched to BiPAP 21/17 but inadequate time to titrate   HST 2019: Severe central/obstructive sleep apnea with AHI 70/hr   PFTs are within normal limit     LDCT chest 09/2022 >> stable 14 x 8mm nodule  LDCT chest 03/2022 stable 8mm nodule LDCT chest 08/2021 largest RML 8mm nodule  Review of Systems neg for any significant sore throat, dysphagia, itching, sneezing, nasal congestion or excess/ purulent secretions, fever, chills, sweats, unintended wt loss, pleuritic or exertional cp, hempoptysis, orthopnea pnd or change in chronic leg swelling. Also denies presyncope, palpitations, heartburn, abdominal pain, nausea, vomiting, diarrhea or change in bowel or urinary habits, dysuria,hematuria, rash, arthralgias, visual complaints, headache, numbness weakness or ataxia.     Objective:   Physical Exam  Gen. Pleasant, obese, in no distress ENT - no lesions, no  post nasal drip Neck: No JVD, no thyromegaly, no carotid bruits Lungs: no use of accessory muscles, no dullness to percussion, decreased without rales or rhonchi  Cardiovascular: Rhythm regular, heart sounds  normal, no murmurs or gallops, no peripheral edema Musculoskeletal: No deformities, no cyanosis or clubbing , no tremors       Assessment & Plan:

## 2022-09-19 NOTE — Patient Instructions (Addendum)
X change auto CPAP pr to 14-18 cm   LDCT scan in aug 2025

## 2022-09-19 NOTE — Assessment & Plan Note (Signed)
CPAP download was reviewed and events are significantly decreased with residual AHI of 10/hour on auto settings 12 to 15 cm with average pressure of 14 cm.  Compliance is slight worse than last time mainly because of his leak and this should improve once he gets the part that he is missing. Residual AHI on certain nights goes as high as 20/hour. We will increase the range of his auto CPAP to 14 to 18 cm and hopefully that should bring his residual AHI within line.  I did not see any central apneas and did not feel the need for repeat titration study at this time  Weight loss encouraged, compliance with goal of at least 4-6 hrs every night is the expectation. Advised against medications with sedative side effects Cautioned against driving when sleepy - understanding that sleepiness will vary on a day to day basis

## 2022-09-19 NOTE — Assessment & Plan Note (Signed)
Stable over 1 year and likely benign.  We we will plan for 1 year follow-up CT chest without contrast in 09/2023

## 2022-10-17 ENCOUNTER — Encounter: Payer: Self-pay | Admitting: Nurse Practitioner

## 2022-10-17 ENCOUNTER — Ambulatory Visit: Payer: Managed Care, Other (non HMO) | Admitting: Nurse Practitioner

## 2022-10-17 VITALS — BP 130/82 | HR 76 | Temp 97.8°F | Ht 69.0 in | Wt 290.6 lb

## 2022-10-17 DIAGNOSIS — E1165 Type 2 diabetes mellitus with hyperglycemia: Secondary | ICD-10-CM

## 2022-10-17 LAB — POCT GLYCOSYLATED HEMOGLOBIN (HGB A1C): Hemoglobin A1C: 6 % — AB (ref 4.0–5.6)

## 2022-10-17 NOTE — Patient Instructions (Signed)
Nice to see you today Your A1C was 6.0% today. That is great When it is time for a refill on the mounjaro let me know and I will increase it to the next dose of 7.5 I want to see you in 4 months, sooner if you need me

## 2022-10-17 NOTE — Assessment & Plan Note (Signed)
Patient currently maintained on Mounjaro 5 mg once weekly.  Patient is A1c of 6.0% today patient's weight loss has stalled will increase to 7.5 mg once weekly after patient finishes the month of 5 mg that he has left.  Patient denies any hypoglycemia currently did inform patient that with increased dose risk for low glucose increases

## 2022-10-17 NOTE — Progress Notes (Signed)
Established Patient Office Visit  Subjective   Patient ID: Mitchell Balingit., male    DOB: 08/07/1960  Age: 62 y.o. MRN: [id number]  Chief Complaint  Patient presents with   Diabetes    Pt states he has been feeling pretty good. Plans on having diabetic eye exam done soon.       DM2: Patient currently maintained on Mounjaro 5 mg weekly.  At last office visit he had an appreciable weight loss of 7 pounds and was tolerating the medication well.  Patient also takes berberine. States that he feels like it works well for 2-3 days. States that he is getting 100-125 and he will check it in the morning. States that he will check his sugar sometimes in the afternoons and evenings and get about the same readings.  Patient states he is going through a a lot personally currently.  States of longtime friend of his is in liver failure and a liver transplant currently in the hospital.  Patient also mentions that his son who is 8 years old sustained an injury while playing basketball which caused a blood clot that released and caused a stroke.  Patient states his son is doing well with no deficits.  Patient also mentions that his wife was finally evaluated by rheumatology but is having some memory issues and was referred to the neurologist.  Patient denies HI/SI/AVH.    Review of Systems  Constitutional:  Negative for chills and fever.  Respiratory:  Negative for shortness of breath.   Cardiovascular:  Negative for chest pain.  Neurological:  Negative for headaches.  Psychiatric/Behavioral:  Negative for hallucinations and suicidal ideas.       Objective:     BP 130/82   Pulse 76   Temp 97.8 F (36.6 C) (Temporal)   Ht 5\' 9"  (1.753 m)   Wt 290 lb 9.6 oz (131.8 kg)   SpO2 93%   BMI 42.91 kg/m  BP Readings from Last 3 Encounters:  10/17/22 130/82  09/19/22 128/80  09/12/22 (!) 130/90   Wt Readings from Last 3 Encounters:  10/17/22 290 lb 9.6 oz (131.8 kg)  09/19/22 291 lb (132 kg)   09/12/22 286 lb 12.8 oz (130.1 kg)      Physical Exam Vitals and nursing note reviewed.  Constitutional:      Appearance: Normal appearance.  Cardiovascular:     Rate and Rhythm: Normal rate and regular rhythm.     Heart sounds: Normal heart sounds.  Pulmonary:     Effort: Pulmonary effort is normal.     Breath sounds: Normal breath sounds.  Abdominal:     General: Bowel sounds are normal.  Neurological:     Mental Status: He is alert.      Results for orders placed or performed in visit on 10/17/22  POCT glycosylated hemoglobin (Hb A1C)  Result Value Ref Range   Hemoglobin A1C 6.0 (A) 4.0 - 5.6 %   HbA1c POC (<> result, manual entry)     HbA1c, POC (prediabetic range)     HbA1c, POC (controlled diabetic range)        The 10-year ASCVD risk score (Arnett DK, et al., 2019) is: 19.5%    Assessment & Plan:   Problem List Items Addressed This Visit       Endocrine   Type 2 diabetes mellitus with hyperglycemia, without long-term current use of insulin (HCC) - Primary    Patient currently maintained on Mounjaro 5 mg once weekly.  Patient is A1c of 6.0% today patient's weight loss has stalled will increase to 7.5 mg once weekly after patient finishes the month of 5 mg that he has left.  Patient denies any hypoglycemia currently did inform patient that with increased dose risk for low glucose increases      Relevant Orders   POCT glycosylated hemoglobin (Hb A1C) (Completed)    Return in about 4 months (around 02/16/2023) for CPE and Labs.    Audria Nine, NP

## 2022-11-15 ENCOUNTER — Other Ambulatory Visit: Payer: Self-pay | Admitting: Nurse Practitioner

## 2022-11-15 DIAGNOSIS — E1165 Type 2 diabetes mellitus with hyperglycemia: Secondary | ICD-10-CM

## 2022-11-16 MED ORDER — TIRZEPATIDE 7.5 MG/0.5ML ~~LOC~~ SOAJ: 7.5000 mg | SUBCUTANEOUS | 0 refills | Status: DC

## 2022-11-18 ENCOUNTER — Telehealth: Payer: Self-pay | Admitting: Acute Care

## 2022-11-18 DIAGNOSIS — R911 Solitary pulmonary nodule: Secondary | ICD-10-CM

## 2022-11-18 NOTE — Telephone Encounter (Signed)
Called and spoke to patient regarding his LDCT in 09/2022 which resulted as a Lung Rads 2. The nodule in question has grown from 7.77mm in 08/2021 to 8.3mm in 03/2022 to 9.79mm in 09/2022. Because of this growth Kandice Robinsons NP would prefer patient have a 6 month follow up scan due in 03/2023. Patient verbalized understanding and is agreeable to this. Patient was extremely grateful for the precaution. Order placed for 6 month scan and results and plan sent to PCP.

## 2022-11-24 ENCOUNTER — Other Ambulatory Visit: Payer: Self-pay | Admitting: Nurse Practitioner

## 2022-11-24 DIAGNOSIS — E1165 Type 2 diabetes mellitus with hyperglycemia: Secondary | ICD-10-CM

## 2022-12-24 ENCOUNTER — Other Ambulatory Visit: Payer: Self-pay | Admitting: Nurse Practitioner

## 2023-01-09 ENCOUNTER — Ambulatory Visit: Payer: Managed Care, Other (non HMO) | Admitting: Nurse Practitioner

## 2023-02-14 ENCOUNTER — Other Ambulatory Visit: Payer: Self-pay | Admitting: Nurse Practitioner

## 2023-02-17 ENCOUNTER — Ambulatory Visit: Payer: Managed Care, Other (non HMO) | Admitting: Nurse Practitioner

## 2023-02-20 ENCOUNTER — Ambulatory Visit (INDEPENDENT_AMBULATORY_CARE_PROVIDER_SITE_OTHER)
Admission: RE | Admit: 2023-02-20 | Discharge: 2023-02-20 | Disposition: A | Discharge status: Home / Self Care | Payer: Managed Care, Other (non HMO) | Source: Ambulatory Visit | Attending: Nurse Practitioner | Admitting: Nurse Practitioner

## 2023-02-20 ENCOUNTER — Encounter: Payer: Self-pay | Admitting: Nurse Practitioner

## 2023-02-20 ENCOUNTER — Ambulatory Visit: Payer: Managed Care, Other (non HMO) | Admitting: Nurse Practitioner

## 2023-02-20 VITALS — BP 140/90 | HR 79 | Temp 98.2°F | Ht 69.0 in | Wt 290.4 lb

## 2023-02-20 DIAGNOSIS — Z7985 Long-term (current) use of injectable non-insulin antidiabetic drugs: Secondary | ICD-10-CM | POA: Diagnosis not present

## 2023-02-20 DIAGNOSIS — E1165 Type 2 diabetes mellitus with hyperglycemia: Secondary | ICD-10-CM | POA: Diagnosis not present

## 2023-02-20 DIAGNOSIS — G4486 Cervicogenic headache: Secondary | ICD-10-CM

## 2023-02-20 LAB — POCT GLYCOSYLATED HEMOGLOBIN (HGB A1C): Hemoglobin A1C: 5.9 % — AB (ref 4.0–5.6)

## 2023-02-20 MED ORDER — CYCLOBENZAPRINE HCL 5 MG PO TABS
5.0000 mg | ORAL_TABLET | Freq: Two times a day (BID) | ORAL | 0 refills | Status: DC | PRN
Start: 2023-02-20 — End: 2023-03-22

## 2023-02-20 NOTE — Progress Notes (Signed)
Established Patient Office Visit  Subjective   Patient ID: Mitchell Lewis., male    DOB: [birth year]  Age: 63 y.o. MRN: [id number]  Chief Complaint  Patient presents with   Follow-up    Pt states he's been doing good. Has not seen anyone for Diabetic eye exam.    HPI  DM2: patient is currenlty maintained on berberine and mounjaro 7.5mg  weekly  He will check his sugar levels sometimes in the morning and will get 103 this morning. States that in the evening he is 103-112.  States that he is having headache across the bottom part of the skull. It is almost daily. States that the headache approx 1 month. States that he feels and sounds like gravel in the neck. States that the pain is described as a tight aching pain and if he sleeps wrong it can be worse. States that he has tinnutis and when he moves his neck and hears the "gravel". It will make the tinniutus worse. States that he has been using aleve that helps some.       Review of Systems  Constitutional:  Negative for chills and fever.  Respiratory:  Negative for shortness of breath.   Cardiovascular:  Negative for chest pain.  Gastrointestinal:  Negative for abdominal pain, diarrhea, nausea and vomiting.  Neurological:  Negative for headaches.  Psychiatric/Behavioral:  Negative for hallucinations and suicidal ideas.       Objective:     BP (!) 140/90   Pulse 79   Temp 98.2 F (36.8 C) (Oral)   Ht 5\' 9"  (1.753 m)   Wt 290 lb 6.4 oz (131.7 kg)   SpO2 95%   BMI 42.88 kg/m  BP Readings from Last 3 Encounters:  02/20/23 (!) 140/90  10/17/22 130/82  09/19/22 128/80   Wt Readings from Last 3 Encounters:  02/20/23 290 lb 6.4 oz (131.7 kg)  10/17/22 290 lb 9.6 oz (131.8 kg)  09/19/22 291 lb (132 kg)   SpO2 Readings from Last 3 Encounters:  02/20/23 95%  10/17/22 93%  09/19/22 95%      Physical Exam Vitals and nursing note reviewed.  Constitutional:      Appearance: Normal appearance.  HENT:      Mouth/Throat:     Mouth: Mucous membranes are moist.     Pharynx: Oropharynx is clear.  Eyes:     Extraocular Movements: Extraocular movements intact.     Pupils: Pupils are equal, round, and reactive to light.  Cardiovascular:     Rate and Rhythm: Normal rate and regular rhythm.     Heart sounds: Normal heart sounds.  Pulmonary:     Effort: Pulmonary effort is normal.     Breath sounds: Normal breath sounds.  Abdominal:     General: Bowel sounds are normal.  Musculoskeletal:     Cervical back: No tenderness, bony tenderness or crepitus. No pain with movement. Decreased range of motion.  Lymphadenopathy:     Cervical: No cervical adenopathy.  Neurological:     Mental Status: He is alert.      Results for orders placed or performed in visit on 02/20/23  POCT glycosylated hemoglobin (Hb A1C)  Result Value Ref Range   Hemoglobin A1C 5.9 (A) 4.0 - 5.6 %   HbA1c POC (<> result, manual entry)     HbA1c, POC (prediabetic range)     HbA1c, POC (controlled diabetic range)        The 10-year ASCVD risk score (Arnett DK, et  al., 2019) is: 21.9%    Assessment & Plan:   Problem List Items Addressed This Visit       Endocrine   Type 2 diabetes mellitus with hyperglycemia, without long-term current use of insulin (HCC) - Primary   Patient currently maintained on Mounjaro 7.5 mg weekly and over-the-counter berberine.  Patient's A1c well-controlled at 5.9%.  Did offer to increase Mounjaro to 10 mg weekly to help with weight loss.  Patient politely declined.  Continue Mounjaro 7.5 mg weekly.  Continue checking glucose at home.  Continue work on lifestyle modification      Relevant Orders   POCT glycosylated hemoglobin (Hb A1C) (Completed)     Other   Cervicogenic headache   No known injury.  Does sound tension related will do Flexeril 5 mg twice daily as needed.  Sedation precautions reviewed.  Pending cervical films today      Relevant Medications   cyclobenzaprine (FLEXERIL)  5 MG tablet   Other Relevant Orders   DG Cervical Spine Complete    Return in about 6 months (around 08/20/2023) for CPE and Labs.    Audria Nine, NP

## 2023-02-20 NOTE — Assessment & Plan Note (Signed)
No known injury.  Does sound tension related will do Flexeril 5 mg twice daily as needed.  Sedation precautions reviewed.  Pending cervical films today

## 2023-02-20 NOTE — Patient Instructions (Addendum)
Nice to see you today I will be in touch with the xrays once I have the results Follow up with me in 6 months for your physical and labs, sooner if you need me

## 2023-02-20 NOTE — Assessment & Plan Note (Signed)
Patient currently maintained on Mounjaro 7.5 mg weekly and over-the-counter berberine.  Patient's A1c well-controlled at 5.9%.  Did offer to increase Mounjaro to 10 mg weekly to help with weight loss.  Patient politely declined.  Continue Mounjaro 7.5 mg weekly.  Continue checking glucose at home.  Continue work on lifestyle modification

## 2023-02-24 ENCOUNTER — Telehealth: Payer: Self-pay | Admitting: Nurse Practitioner

## 2023-02-24 ENCOUNTER — Encounter: Payer: Self-pay | Admitting: Nurse Practitioner

## 2023-02-24 DIAGNOSIS — R202 Paresthesia of skin: Secondary | ICD-10-CM

## 2023-02-24 DIAGNOSIS — G4486 Cervicogenic headache: Secondary | ICD-10-CM

## 2023-02-24 DIAGNOSIS — M542 Cervicalgia: Secondary | ICD-10-CM

## 2023-02-24 NOTE — Telephone Encounter (Signed)
-----   Message from [place] I sent at 02/24/2023 11:05 AM EST ----- Patient notified of lab results and verbalized understanding.  Patient does have numbness and tingling daily in arms and has for a while now. Patient has not started the musle relaxer yet as he's been out of town; it has been picked up by his wife from the pharmacy. He stated that he will start on it as soon as he returns and if that does not help for a couple days he will call us back to let us know.

## 2023-02-24 NOTE — Telephone Encounter (Signed)
Noted. If it does not help we will need to get an MRI of the neck. I hope it will help for the headaches. I do not think it will help with the numbness and tingling

## 2023-02-28 NOTE — Telephone Encounter (Signed)
Contacted pt to see if he was back from being out of town. Pt states that he comes back Thursday. Will call office if medication does not help.

## 2023-03-01 ENCOUNTER — Encounter: Payer: Self-pay | Admitting: Acute Care

## 2023-03-03 NOTE — Telephone Encounter (Signed)
Tried to call pt. No Answer and No VM to leave a message.

## 2023-03-07 NOTE — Telephone Encounter (Signed)
Spoke with patient and asked has he started the medication. Patient states that he did and it has not helped at all. I did advise that if they are not working that a MRI may be ordered but we will follow up

## 2023-03-09 NOTE — Telephone Encounter (Signed)
 I think we pursue an MRI. Does he want [place] or [place]. Also has he and them before and tolerate the procedure?

## 2023-03-09 NOTE — Telephone Encounter (Signed)
 Contacted pt.   Pt prefers [place] location for MRI. Pt also stated that he has had one in the past and was able to tolerate procedure with Valium .

## 2023-03-10 ENCOUNTER — Encounter: Payer: Self-pay | Admitting: *Deleted

## 2023-03-10 MED ORDER — DIAZEPAM 5 MG PO TABS: ORAL_TABLET | ORAL | 0 refills | Status: AC

## 2023-03-10 NOTE — Addendum Note (Signed)
 Addended by: Dorothe Gaster on: 03/10/2023 09:31 AM   Modules accepted: Orders

## 2023-03-10 NOTE — Telephone Encounter (Signed)
 Order has been placed. I will write a 5mg  valium  for him to take approx 15 mins prior to the scan

## 2023-03-10 NOTE — Telephone Encounter (Signed)
 Contacted pt.  Pt has been made aware of script of Valium  5mg  and order placed for MRI. \ No further questions or concerns.

## 2023-03-13 ENCOUNTER — Ambulatory Visit
Admission: RE | Admit: 2023-03-13 | Discharge: 2023-03-13 | Disposition: A | Discharge status: Home / Self Care | Payer: Managed Care, Other (non HMO) | Source: Ambulatory Visit | Attending: Nurse Practitioner | Admitting: Nurse Practitioner

## 2023-03-13 DIAGNOSIS — R911 Solitary pulmonary nodule: Secondary | ICD-10-CM

## 2023-03-15 ENCOUNTER — Telehealth: Payer: Self-pay | Admitting: Nurse Practitioner

## 2023-03-15 MED ORDER — PREDNISONE 20 MG PO TABS: ORAL_TABLET | ORAL | 0 refills | Status: AC

## 2023-03-15 NOTE — Telephone Encounter (Signed)
Looks like [employer] will not pay for the MRI until you have done 6 weeks of conservative therapy. The muscle relaxer counts as some. I would recommend doing some steroids then if that does not help and the time has lapsed we resubmit for the MRI.  See if the patient is good with that plan

## 2023-03-15 NOTE — Telephone Encounter (Signed)
Contacted pt to inform him of insurance denial of MRI.  Pt verbalized understanding.  Pt agrees with the plan.  Pt complains that the muscle relaxer's do not work and only helps with his sleep at night but not during the day while working. Pt agrees to steroids treatment.

## 2023-03-15 NOTE — Telephone Encounter (Signed)
Copied from CRM [id number]. Topic: Referral - Question >> Mar 15, 2023  8:22 AM Fonda Kinder J wrote: Reason for CRM: Pts insurance denied the pts request for MRI of surgical spine because the provider's notes doesn't state that the pt completed pt 6 weeks of conservative treatment, [facility] wants to know would the NP like to do a peer to peer or an appeal on the pts behalf to get him approved? Callback # [phone] ext [phone]

## 2023-03-15 NOTE — Telephone Encounter (Signed)
I was unaware that [facility] was running an Authorization as well in the back ground. I initiated this auth on 2/7 and its been is Medical Review since then.   I was able to find a work around to get more information on why it denied. I was able to pull their auth up on [employer].    This is why it was denied due to   ? The notes sent to [place] do not show you have completed a full six weeks of this type of treatment. ? Your treatment did not occur within the last 12 weeks. ? Symptoms must be the same or worse after treatment to support imaging. ? The notes sent to [place] do not show any contact with your provider after you completed your treatment. This contact is needed to plan your future care  Peer to Peer is needed Customer ID #: [id number] Reference Number: [id number]  Call [phone] to discuss this decision with a physician reviewer   Please let me know once you have approval information so that I can make [facility] aware. Thanks!

## 2023-03-15 NOTE — Addendum Note (Signed)
Addended by: Eden Emms on: 03/15/2023 07:25 PM   Modules accepted: Orders

## 2023-03-16 ENCOUNTER — Encounter: Payer: Self-pay | Admitting: Nurse Practitioner

## 2023-03-18 ENCOUNTER — Other Ambulatory Visit: Payer: Managed Care, Other (non HMO)

## 2023-03-20 ENCOUNTER — Other Ambulatory Visit: Payer: Self-pay | Admitting: Nurse Practitioner

## 2023-03-20 MED ORDER — MOUNJARO 7.5 MG/0.5ML ~~LOC~~ SOAJ: 7.5000 mg | SUBCUTANEOUS | 1 refills | Status: DC

## 2023-03-21 ENCOUNTER — Other Ambulatory Visit: Payer: Self-pay | Admitting: Nurse Practitioner

## 2023-03-21 DIAGNOSIS — G4486 Cervicogenic headache: Secondary | ICD-10-CM

## 2023-03-26 NOTE — Progress Notes (Unsigned)
 @Patient  ID: Mitchell Mans., male    DOB: 1960/05/11, 63 y.o.   MRN: [id number]  No chief complaint on file.   Referring provider: Eden Emms, NP  HPI: 63 year old male, former smoker quit in 2015 (40-pack-year history).  Past medical history significant for severe obstructive sleep apnea, centrilobular emphysema, pulmonary nodule, hepatic steatosis, type 2 diabetes, malignant skin neoplasm, obesity. Patient of Dr. Vassie Loll   Previous [facility] pulmonary encounter: 06/20/2022 Patient presents today for OSA follow-up. Patient has severe OSA, he actually required BIPAP 21/17 but felt that auto CPAP would suffice. Download showed residual AHI 32/hour with an average pressure 15cm h20 (max pressure 16cm h20). During his last visit Dr. Vassie Loll his CPAP settings were increased to 12-20cm h20.   He is doing really well. Fatigue has significantly improved, he does not get as tired during the day time. He is sleeping well at night. He gets on average 5-6 hours of sleep a night, this is normal for him and he feels rested. He is having a lot of airleaks. He uses a full face mask. Mask often rides up. He sleeps on his side. He prefers not to use heated humidity in the summer.  We talked about trying a cpap pillow for side sleepers .  Airview download 05/18/22- 06/16/22 Usage 29/30 days used; 27 days (90%) > 4 hours Average usage 5 hours 55 mins Pressure 12-20cm h20 (13.3cm h20-95%) Airleaks 83L/min (95%) AHI 12.1  03/27/2023- Interim  Patient presents today for 6 month follow-up COPD and pulmonary nodule. During our last visit we adjusted his pressure settings   LDCT in 09/2022 which resulted as a Lung Rads 2. The nodule in question has grown from 7.5mm in 08/2021 to 8.60mm in 03/2022 to 9.29mm in 09/2022. Because of this growth Kandice Robinsons NP would prefer patient have a 6 month follow up scan    Allergies  Allergen Reactions   Other Shortness Of Breath and Itching    Chicken feathers   Sulfa Antibiotics Rash  and Hives    Immunization History  Administered Date(s) Administered   Influenza-Unspecified 09/18/2017    Past Medical History:  Diagnosis Date   Colon polyp    Fatty liver    History of colon polyps 2018   Prediabetes 09/15/2017   Sleep apnea    Subareolar mass of left breast 11/10/2020    Tobacco History: Social History   Tobacco Use  Smoking Status Former   Current packs/day: 0.00   Average packs/day: 1 pack/day for 40.0 years (40.0 ttl pk-yrs)   Types: Cigarettes   Start date: 05/15/1973   Quit date: 05/15/2013   Years since quitting: 9.8  Smokeless Tobacco Never   Counseling given: Not Answered   Outpatient Medications Prior to Visit  Medication Sig Dispense Refill   Accu-Chek Softclix Lancets lancets Use as instructed daily to check sugars. DX R73.03 100 each 12   albuterol (VENTOLIN HFA) 108 (90 Base) MCG/ACT inhaler Inhale 2 puffs into the lungs every 6 (six) hours as needed for wheezing or shortness of breath. (Patient not taking: Reported on 02/20/2023) 8 g 6   Berberine Chloride (BERBERINE HCI PO) Take by mouth. 1 BID     Blood Glucose Monitoring Suppl (ACCU-CHEK GUIDE ME) w/Device KIT USE DAILY AS NEEDED TO CHECK SUGAR 1 kit 0   Cholecalciferol (DIALYVITE VITAMIN D 5000) 125 MCG (5000 UT) capsule Take 5,000 Units by mouth daily.     clobetasol (TEMOVATE) 0.05 % external solution Apply 1 Application  topically 2 (two) times daily as needed.     cyclobenzaprine (FLEXERIL) 5 MG tablet TAKE 1 TABLET BY MOUTH 2 TIMES DAILY AS NEEDED. 30 tablet 0   diazepam (VALIUM) 5 MG tablet Take 1 tablet prior to MRI. May repeat once If needed 2 tablet 0   glucose blood (ACCU-CHEK GUIDE) test strip Use as needed daily to check sugars. Dx R73.03 100 each 12   ketoconazole (NIZORAL) 2 % shampoo Apply topically.     NON FORMULARY MULLEN drops and Turmeric drops     Pediatric Multivitamins-Iron (FLINTSTONES COMPLETE PO) Take 1 tablet by mouth daily.     sertraline (ZOLOFT) 100 MG  tablet TAKE 1 TABLET BY MOUTH EVERY DAY 90 tablet 3   tirzepatide (MOUNJARO) 7.5 MG/0.5ML Pen Inject 7.5 mg into the skin once a week. 6 mL 1   zinc gluconate 50 MG tablet Take 50 mg by mouth daily.     No facility-administered medications prior to visit.      Review of Systems  Review of Systems   Physical Exam  There were no vitals taken for this visit. Physical Exam   Lab Results:  CBC    Component Value Date/Time   WBC 7.5 08/01/2022 0850   RBC 4.80 08/01/2022 0850   HGB 15.8 08/01/2022 0850   HGB 17.4 12/16/2019 0924   HCT 47.0 08/01/2022 0850   HCT 51.8 (H) 12/16/2019 0924   PLT 242.0 08/01/2022 0850   PLT 270 12/16/2019 0924   MCV 97.8 08/01/2022 0850   MCV 97 12/16/2019 0924   MCH 32.5 12/16/2019 0924   MCHC 33.7 08/01/2022 0850   RDW 14.3 08/01/2022 0850   RDW 12.2 12/16/2019 0924   LYMPHSABS 2.7 03/01/2017 1001   MONOABS 1.0 03/01/2017 1001   EOSABS 0.2 03/01/2017 1001   BASOSABS 0.1 03/01/2017 1001    BMET    Component Value Date/Time   NA 140 08/01/2022 0850   NA 137 12/16/2019 0924   K 5.0 08/01/2022 0850   CL 103 08/01/2022 0850   CO2 30 08/01/2022 0850   GLUCOSE 112 (H) 08/01/2022 0850   BUN 17 08/01/2022 0850   BUN 17 12/16/2019 0924   CREATININE 0.94 08/01/2022 0850   CREATININE 1.07 09/15/2017 1631   CALCIUM 9.9 08/01/2022 0850   GFRNONAA 88 12/16/2019 0924   GFRAA 102 12/16/2019 0924    BNP No results found for: "BNP"  ProBNP No results found for: "PROBNP"  Imaging: No results found.   Assessment & Plan:   No problem-specific Assessment & Plan notes found for this encounter.     Glenford Bayley, NP 03/26/2023

## 2023-03-27 ENCOUNTER — Encounter: Payer: Self-pay | Admitting: Primary Care

## 2023-03-27 ENCOUNTER — Other Ambulatory Visit: Payer: Self-pay | Admitting: Acute Care

## 2023-03-27 ENCOUNTER — Ambulatory Visit: Payer: Managed Care, Other (non HMO) | Admitting: Primary Care

## 2023-03-27 VITALS — BP 130/90 | HR 89 | Ht 68.0 in | Wt 287.8 lb

## 2023-03-27 DIAGNOSIS — R911 Solitary pulmonary nodule: Secondary | ICD-10-CM

## 2023-03-27 DIAGNOSIS — Z122 Encounter for screening for malignant neoplasm of respiratory organs: Secondary | ICD-10-CM

## 2023-03-27 DIAGNOSIS — G4733 Obstructive sleep apnea (adult) (pediatric): Secondary | ICD-10-CM | POA: Diagnosis not present

## 2023-03-27 DIAGNOSIS — Z87891 Personal history of nicotine dependence: Secondary | ICD-10-CM

## 2023-03-27 NOTE — Patient Instructions (Signed)
 -  OBSTRUCTIVE SLEEP APNEA (OSA): Obstructive sleep apnea is a condition where your breathing stops and starts during sleep due to blocked airways. Your CPAP settings have been effective in reducing apneic events to less than two per hour. Continue using your CPAP machine with the current settings and follow up in one year unless you experience any issues.  -PULMONARY NODULES: Pulmonary nodules are small growths in the lungs. Your lung scan shows that the nodules have remained stable over the past year and are considered benign. We will repeat the CT scan in August 2025 to monitor them.  -CHRONIC OBSTRUCTIVE PULMONARY DISEASE (COPD): COPD is a chronic lung disease that makes it hard to breathe. You have been managing your symptoms well and only need to use your albuterol inhaler occasionally. Continue with your current management plan and use the inhaler as needed. If you find that you need to use it daily, please come back for reassessment.  -OBESITY AND BORDERLINE DIABETES: Obesity and borderline diabetes are conditions that can affect your overall health. You are currently taking Mounjaro at 7.5 mg, which has significantly improved your A1c levels to 5.8, although weight loss has been modest. Continue with the current dose. If you wish to explore other weight loss medications, discuss this with your primary care provider.  -HYPERTENSION: Hypertension is high blood pressure. Your blood pressure was slightly elevated at 131/90 but improved to 130/80 upon recheck. Continue with your current management plan. If your blood pressure remains consistently elevated, please return for reassessment.  INSTRUCTIONS:  Please follow up in one year for your obstructive sleep apnea unless you experience any issues. We will repeat the CT scan for your pulmonary nodules in August 2025. If you need to use your albuterol inhaler daily or if your blood pressure remains consistently elevated, please return for  reassessment. Continue with your current medications and management plans, and discuss any concerns about weight loss medications with your primary care provider.  Follow-up 1 year with Waynetta Sandy NP

## 2023-03-29 ENCOUNTER — Telehealth: Payer: Self-pay | Admitting: Nurse Practitioner

## 2023-03-29 DIAGNOSIS — R200 Anesthesia of skin: Secondary | ICD-10-CM

## 2023-03-29 DIAGNOSIS — M542 Cervicalgia: Secondary | ICD-10-CM

## 2023-03-29 DIAGNOSIS — G4486 Cervicogenic headache: Secondary | ICD-10-CM

## 2023-03-29 NOTE — Telephone Encounter (Signed)
 Can we see if the patient had good results or results at all with the steroids

## 2023-03-29 NOTE — Telephone Encounter (Signed)
 Contacted pt for an update on steroids.  Pt complains of not having good results with the steroids.  Pt states he has been having more frequent headaches since starting.  Pt states he has been taking steroids and muscle relaxer as prescribed.

## 2023-03-29 NOTE — Telephone Encounter (Signed)
-----   Message from [facility] sent at 03/15/2023  7:25 PM EST ----- Regarding: Paresthesias See how steroids did

## 2023-03-31 NOTE — Addendum Note (Signed)
 Addended by: Eden Emms on: 03/31/2023 01:44 PM   Modules accepted: Orders

## 2023-03-31 NOTE — Telephone Encounter (Signed)
 I am going to place the order for the MRI again as it has been 6 weeks and he has failed conservative therapy of muscle relaxers and steroids

## 2023-04-03 NOTE — Telephone Encounter (Signed)
 Pt has been scheduled for MRI on 04/14/23

## 2023-04-06 ENCOUNTER — Other Ambulatory Visit: Payer: Self-pay | Admitting: Nurse Practitioner

## 2023-04-06 DIAGNOSIS — G4486 Cervicogenic headache: Secondary | ICD-10-CM

## 2023-04-14 ENCOUNTER — Ambulatory Visit
Admission: RE | Admit: 2023-04-14 | Discharge: 2023-04-14 | Disposition: A | Discharge status: Home / Self Care | Payer: Managed Care, Other (non HMO) | Source: Ambulatory Visit | Attending: Nurse Practitioner | Admitting: Nurse Practitioner

## 2023-04-14 DIAGNOSIS — G4486 Cervicogenic headache: Secondary | ICD-10-CM

## 2023-04-14 DIAGNOSIS — M542 Cervicalgia: Secondary | ICD-10-CM

## 2023-04-14 DIAGNOSIS — R200 Anesthesia of skin: Secondary | ICD-10-CM

## 2023-05-02 ENCOUNTER — Encounter: Payer: Self-pay | Admitting: Nurse Practitioner

## 2023-05-02 NOTE — Telephone Encounter (Signed)
 Contacted [facility]ology and imaging.  No agent was able to take the call. Will call again later.  Left detailed voicemail.

## 2023-05-02 NOTE — Telephone Encounter (Signed)
 Can we see if they will read the MR C spine please

## 2023-05-03 ENCOUNTER — Telehealth: Payer: Self-pay

## 2023-05-03 NOTE — Telephone Encounter (Signed)
 Contacted pt to inform him that reports for MRI of spine have been released.  Pt states that he has seen them on mychart.  Advised to patient that matt will have to review the report and then will reach out regarding next steps.  Pt verbalized understanding and has no questions.

## 2023-05-03 NOTE — Telephone Encounter (Signed)
 Contacted [facility]ology.  Spoke with [place].  MRI of spine has not been read yet but was informed that the they will get in contact with the radiology team to see if they can expedite the process and read the report.

## 2023-05-03 NOTE — Telephone Encounter (Signed)
 Contacted [facility]. No answer, left a voicemail to call office back.

## 2023-05-03 NOTE — Telephone Encounter (Signed)
 Copied from CRM [id number]. Topic: Clinical - Lab/Test Results >> May 03, 2023 11:32 AM Florestine Avers wrote: Reason for CRM:  Haywood Lasso from radiology saying she was returning somebody named Janae's call. She said it was in regards to a patients reports. Haywood Lasso is asking for a call back and can be reached at [phone]

## 2023-05-03 NOTE — Telephone Encounter (Signed)
 Copied from CRM [id number]. Topic: Clinical - Lab/Test Results >> May 03, 2023 11:32 AM Florestine Avers wrote: Reason for CRM:  Haywood Lasso from radiology saying she was returning somebody named Janae's call. She said it was in regards to a patients reports. Haywood Lasso is asking for a call back and can be reached at [phone] >> May 03, 2023  2:57 PM Truddie Crumble wrote: Haywood Lasso from radiology is returning a call to the office

## 2023-05-03 NOTE — Telephone Encounter (Signed)
 Spoke with radiology and pt has seen results released on mychart.

## 2023-05-04 ENCOUNTER — Encounter: Payer: Self-pay | Admitting: Nurse Practitioner

## 2023-05-04 DIAGNOSIS — M4802 Spinal stenosis, cervical region: Secondary | ICD-10-CM

## 2023-05-09 ENCOUNTER — Other Ambulatory Visit: Payer: Self-pay | Admitting: Nurse Practitioner

## 2023-05-09 DIAGNOSIS — E1165 Type 2 diabetes mellitus with hyperglycemia: Secondary | ICD-10-CM

## 2023-05-09 DIAGNOSIS — G4486 Cervicogenic headache: Secondary | ICD-10-CM

## 2023-06-01 ENCOUNTER — Telehealth: Payer: Self-pay | Admitting: Pharmacy Technician

## 2023-06-01 NOTE — Telephone Encounter (Signed)
 Pharmacy Patient Advocate Encounter   Received notification from CoverMyMeds that prior authorization for MOUNJARO  is required/requested.   Insurance verification completed.   The patient is insured through [employer] .   Per test claim: PA required; PA submitted to above mentioned insurance via CoverMyMeds Key/confirmation #/EOC [id number] Status is pending

## 2023-06-02 ENCOUNTER — Encounter: Payer: Self-pay | Admitting: Pharmacy Technician

## 2023-06-02 NOTE — Telephone Encounter (Signed)
 Pharmacy Patient Advocate Encounter  Received notification from [employer] that Prior Authorization for mounjaro  has been APPROVED from 06/01/23 to 05/31/24. Spoke to pharmacy to process.Copay is $75.00 for 90 days.    PA #/Case ID/Reference #: [id number]

## 2023-06-05 ENCOUNTER — Other Ambulatory Visit: Payer: Self-pay | Admitting: Neurosurgery

## 2023-06-07 NOTE — Progress Notes (Signed)
 Surgical Instructions   Your procedure is scheduled on Monday Jun 12, 2023. Report to [facility] Main Entrance "A" at 5:30 A.M., then check in with the Admitting office. Any questions or running late day of surgery: call [phone]  Questions prior to your surgery date: call [phone], Monday-Friday, 8am-4pm. If you experience any cold or flu symptoms such as cough, fever, chills, shortness of breath, etc. between now and your scheduled surgery, please notify us  at the above number.     Remember:  Do not eat or drink after midnight the night before your surgery   Take these medicines the morning of surgery with A SIP OF WATER  sertraline  (ZOLOFT )   May take these medicines IF NEEDED: cyclobenzaprine  (FLEXERIL )    One week prior to surgery, STOP taking any Aspirin (unless otherwise instructed by your surgeon) Aleve, Naproxen, Ibuprofen, Motrin, Advil, Goody's, BC's, all herbal medications, fish oil, and non-prescription vitamins.                     Do NOT Smoke (Tobacco/Vaping) for 24 hours prior to your procedure.  If you use a CPAP at night, you may bring your mask/headgear for your overnight stay.   You will be asked to remove any contacts, glasses, piercing's, hearing aid's, dentures/partials prior to surgery. Please bring cases for these items if needed.    Patients discharged the day of surgery will not be allowed to drive home, and someone needs to stay with them for 24 hours.  SURGICAL WAITING ROOM VISITATION Patients may have no more than 2 support people in the waiting area - these visitors may rotate.   Pre-op nurse will coordinate an appropriate time for 1 ADULT support person, who may not rotate, to accompany patient in pre-op.  Children under the age of 76 must have an adult with them who is not the patient and must remain in the main waiting area with an adult.  If the patient needs to stay at the hospital during part of their recovery, the visitor guidelines  for inpatient rooms apply.  Please refer to the [facility] website for the visitor guidelines for any additional information.   If you received a COVID test during your pre-op visit  it is requested that you wear a mask when out in public, stay away from anyone that may not be feeling well and notify your surgeon if you develop symptoms. If you have been in contact with anyone that has tested positive in the last 10 days please notify you surgeon.      Pre-operative 5 CHG Bathing Instructions   You can play a key role in reducing the risk of infection after surgery. Your skin needs to be as free of germs as possible. You can reduce the number of germs on your skin by washing with CHG (chlorhexidine gluconate) soap before surgery. CHG is an antiseptic soap that kills germs and continues to kill germs even after washing.   DO NOT use if you have an allergy to chlorhexidine/CHG or antibacterial soaps. If your skin becomes reddened or irritated, stop using the CHG and notify one of our RNs at [phone].   Please shower with the CHG soap starting 4 days before surgery using the following schedule:     Please keep in mind the following:  DO NOT shave, including legs and underarms, starting the day of your first shower.   You may shave your face at any point before/day of surgery.  Place  clean sheets on your bed the day you start using CHG soap. Use a clean washcloth (not used since being washed) for each shower. DO NOT sleep with pets once you start using the CHG.   CHG Shower Instructions:  Wash your face and private area with normal soap. If you choose to wash your hair, wash first with your normal shampoo.  After you use shampoo/soap, rinse your hair and body thoroughly to remove shampoo/soap residue.  Turn the water OFF and apply about 3 tablespoons (45 ml) of CHG soap to a CLEAN washcloth.  Apply CHG soap ONLY FROM YOUR NECK DOWN TO YOUR TOES (washing for 3-5 minutes)  DO NOT use  CHG soap on face, private areas, open wounds, or sores.  Pay special attention to the area where your surgery is being performed.  If you are having back surgery, having someone wash your back for you may be helpful. Wait 2 minutes after CHG soap is applied, then you may rinse off the CHG soap.  Pat dry with a clean towel  Put on clean clothes/pajamas   If you choose to wear lotion, please use ONLY the CHG-compatible lotions that are listed below.  Additional instructions for the day of surgery: DO NOT APPLY any lotions, deodorants or cologne   Do not bring valuables to the hospital. [facility] is not responsible for any belongings/valuables. Do not wear jewelry  Put on clean/comfortable clothes.  Please brush your teeth.  Ask your nurse before applying any prescription medications to the skin.     CHG Compatible Lotions   Aveeno Moisturizing lotion  Cetaphil Moisturizing Cream  Cetaphil Moisturizing Lotion  Clairol Herbal Essence Moisturizing Lotion, Dry Skin  Clairol Herbal Essence Moisturizing Lotion, Extra Dry Skin  Clairol Herbal Essence Moisturizing Lotion, Normal Skin  Curel Age Defying Therapeutic Moisturizing Lotion with Alpha Hydroxy  Curel Extreme Care Body Lotion  Curel Soothing Hands Moisturizing Hand Lotion  Curel Therapeutic Moisturizing Cream, Fragrance-Free  Curel Therapeutic Moisturizing Lotion, Fragrance-Free  Curel Therapeutic Moisturizing Lotion, Original Formula  Eucerin Daily Replenishing Lotion  Eucerin Dry Skin Therapy Plus Alpha Hydroxy Crme  Eucerin Dry Skin Therapy Plus Alpha Hydroxy Lotion  Eucerin Original Crme  Eucerin Original Lotion  Eucerin Plus Crme Eucerin Plus Lotion  Eucerin TriLipid Replenishing Lotion  Keri Anti-Bacterial Hand Lotion  Keri Deep Conditioning Original Lotion Dry Skin Formula Softly Scented  Keri Deep Conditioning Original Lotion, Fragrance Free Sensitive Skin Formula  Keri Lotion Fast Absorbing Fragrance Free  Sensitive Skin Formula  Keri Lotion Fast Absorbing Softly Scented Dry Skin Formula  Keri Original Lotion  Keri Skin Renewal Lotion Keri Silky Smooth Lotion  Keri Silky Smooth Sensitive Skin Lotion  Nivea Body Creamy Conditioning Oil  Nivea Body Extra Enriched Lotion  Nivea Body Original Lotion  Nivea Body Sheer Moisturizing Lotion Nivea Crme  Nivea Skin Firming Lotion  NutraDerm 30 Skin Lotion  NutraDerm Skin Lotion  NutraDerm Therapeutic Skin Cream  NutraDerm Therapeutic Skin Lotion  ProShield Protective Hand Cream  Provon moisturizing lotion  Please read over the following fact sheets that you were given.

## 2023-06-08 ENCOUNTER — Other Ambulatory Visit: Payer: Self-pay

## 2023-06-08 ENCOUNTER — Encounter (HOSPITAL_COMMUNITY)
Admission: RE | Admit: 2023-06-08 | Discharge: 2023-06-08 | Disposition: A | Discharge status: Home / Self Care | Source: Ambulatory Visit | Attending: Neurosurgery | Admitting: Neurosurgery

## 2023-06-08 ENCOUNTER — Encounter (HOSPITAL_COMMUNITY): Payer: Self-pay

## 2023-06-08 VITALS — BP 136/82 | HR 64 | Temp 98.5°F | Resp 19 | Ht 68.0 in | Wt 275.3 lb

## 2023-06-08 DIAGNOSIS — Z01812 Encounter for preprocedural laboratory examination: Secondary | ICD-10-CM | POA: Insufficient documentation

## 2023-06-08 DIAGNOSIS — Z01818 Encounter for other preprocedural examination: Secondary | ICD-10-CM

## 2023-06-08 HISTORY — DX: Other complications of anesthesia, initial encounter: T88.59XA

## 2023-06-08 HISTORY — DX: Unspecified osteoarthritis, unspecified site: M19.90

## 2023-06-08 LAB — CBC
HCT: 45.6 % (ref 39.0–52.0)
Hemoglobin: 15.4 g/dL (ref 13.0–17.0)
MCH: 32.4 pg (ref 26.0–34.0)
MCHC: 33.8 g/dL (ref 30.0–36.0)
MCV: 95.8 fL (ref 80.0–100.0)
Platelets: 247 10*3/uL (ref 150–400)
RBC: 4.76 MIL/uL (ref 4.22–5.81)
RDW: 12.6 % (ref 11.5–15.5)
WBC: 8.1 10*3/uL (ref 4.0–10.5)
nRBC: 0 % (ref 0.0–0.2)

## 2023-06-08 LAB — SURGICAL PCR SCREEN
MRSA, PCR: NEGATIVE
Staphylococcus aureus: NEGATIVE

## 2023-06-08 NOTE — Progress Notes (Addendum)
 PCP - Winthrop Hawks, NP Cardiologist - Dr. Constancia Delton  PPM/ICD - denies Device Orders - na Rep Notified - na  Chest x-ray - na EKG - na Stress Test -  ECHO - 12/10/2019 Cardiac Cath - 12/24/2019  Sleep Study -  Diagnosed with sleep apnea CPAP - Wears a CPAP nightly  A1C 5.9 02/02/2023  Non-diabetic Mounjaro  for weight loss, last dose 05/31/2023  Blood Thinner Instructions: denies Aspirin Instructions:denies  ERAS Protcol -NPO  Anesthesia review: Yes, Hx heat cath, OSA with CPAP  Patient denies shortness of breath, fever, cough and chest pain at PAT appointment   All instructions explained to the patient, with a verbal understanding of the material. Patient agrees to go over the instructions while at home for a better understanding. Patient also instructed to self quarantine after being tested for COVID-19. The opportunity to ask questions was provided.

## 2023-06-12 ENCOUNTER — Encounter (HOSPITAL_COMMUNITY)
Admission: RE | Disposition: A | Discharge status: Home / Self Care | Payer: Self-pay | Source: Home / Self Care | Attending: Neurosurgery

## 2023-06-12 ENCOUNTER — Encounter (HOSPITAL_COMMUNITY): Payer: Self-pay | Admitting: Neurosurgery

## 2023-06-12 ENCOUNTER — Other Ambulatory Visit: Payer: Self-pay

## 2023-06-12 ENCOUNTER — Ambulatory Visit (HOSPITAL_COMMUNITY)
Admission: RE | Admit: 2023-06-12 | Discharge: 2023-06-12 | Disposition: A | Discharge status: Home / Self Care | Attending: Neurosurgery | Admitting: Neurosurgery

## 2023-06-12 ENCOUNTER — Ambulatory Visit (HOSPITAL_COMMUNITY): Payer: Self-pay

## 2023-06-12 ENCOUNTER — Ambulatory Visit (HOSPITAL_COMMUNITY)

## 2023-06-12 ENCOUNTER — Ambulatory Visit (HOSPITAL_BASED_OUTPATIENT_CLINIC_OR_DEPARTMENT_OTHER): Payer: Self-pay

## 2023-06-12 DIAGNOSIS — M5412 Radiculopathy, cervical region: Secondary | ICD-10-CM | POA: Diagnosis present

## 2023-06-12 DIAGNOSIS — M4712 Other spondylosis with myelopathy, cervical region: Secondary | ICD-10-CM | POA: Insufficient documentation

## 2023-06-12 DIAGNOSIS — Z7985 Long-term (current) use of injectable non-insulin antidiabetic drugs: Secondary | ICD-10-CM | POA: Insufficient documentation

## 2023-06-12 DIAGNOSIS — E66813 Obesity, class 3: Secondary | ICD-10-CM | POA: Diagnosis not present

## 2023-06-12 DIAGNOSIS — J449 Chronic obstructive pulmonary disease, unspecified: Secondary | ICD-10-CM | POA: Diagnosis not present

## 2023-06-12 DIAGNOSIS — M50123 Cervical disc disorder at C6-C7 level with radiculopathy: Secondary | ICD-10-CM | POA: Diagnosis not present

## 2023-06-12 DIAGNOSIS — M50122 Cervical disc disorder at C5-C6 level with radiculopathy: Secondary | ICD-10-CM | POA: Diagnosis not present

## 2023-06-12 DIAGNOSIS — Z87891 Personal history of nicotine dependence: Secondary | ICD-10-CM | POA: Diagnosis not present

## 2023-06-12 DIAGNOSIS — M4802 Spinal stenosis, cervical region: Secondary | ICD-10-CM | POA: Diagnosis present

## 2023-06-12 DIAGNOSIS — E119 Type 2 diabetes mellitus without complications: Secondary | ICD-10-CM

## 2023-06-12 DIAGNOSIS — Z6841 Body Mass Index (BMI) 40.0 and over, adult: Secondary | ICD-10-CM | POA: Insufficient documentation

## 2023-06-12 DIAGNOSIS — G4730 Sleep apnea, unspecified: Secondary | ICD-10-CM | POA: Insufficient documentation

## 2023-06-12 HISTORY — DX: Prediabetes: R73.03

## 2023-06-12 SURGERY — ANTERIOR CERVICAL DECOMPRESSION/DISCECTOMY FUSION 2 LEVELS
Anesthesia: General | Site: Neck

## 2023-06-12 MED ORDER — ONDANSETRON HCL 4 MG/2ML IJ SOLN: 4.0000 mg | Freq: Four times a day (QID) | INTRAMUSCULAR | Status: DC | PRN

## 2023-06-12 MED ORDER — CHLORHEXIDINE GLUCONATE CLOTH 2 % EX PADS: 6.0000 | MEDICATED_PAD | Freq: Once | CUTANEOUS | Status: DC

## 2023-06-12 MED ORDER — CHLORHEXIDINE GLUCONATE 0.12 % MT SOLN
15.0000 mL | Freq: Once | OROMUCOSAL | Status: AC
  Administered 2023-06-12: 15 mL via OROMUCOSAL
  Filled 2023-06-12: qty 15

## 2023-06-12 MED ORDER — SUGAMMADEX SODIUM 200 MG/2ML IV SOLN
INTRAVENOUS | Status: DC | PRN
  Administered 2023-06-12: 200 mg via INTRAVENOUS

## 2023-06-12 MED ORDER — FENTANYL CITRATE (PF) 250 MCG/5ML IJ SOLN
INTRAMUSCULAR | Status: DC | PRN
  Administered 2023-06-12 (×3): 50 ug via INTRAVENOUS
  Administered 2023-06-12: 100 ug via INTRAVENOUS

## 2023-06-12 MED ORDER — 0.9 % SODIUM CHLORIDE (POUR BTL) OPTIME
TOPICAL | Status: DC | PRN
  Administered 2023-06-12: 1000 mL

## 2023-06-12 MED ORDER — MIDAZOLAM HCL 2 MG/2ML IJ SOLN
INTRAMUSCULAR | Status: DC | PRN
  Administered 2023-06-12: 2 mg via INTRAVENOUS

## 2023-06-12 MED ORDER — PHENYLEPHRINE HCL-NACL 20-0.9 MG/250ML-% IV SOLN
INTRAVENOUS | Status: DC | PRN
  Administered 2023-06-12: 25 ug/min via INTRAVENOUS

## 2023-06-12 MED ORDER — THROMBIN 5000 UNITS EX KIT
PACK | CUTANEOUS | Status: AC
  Filled 2023-06-12: qty 3

## 2023-06-12 MED ORDER — TIRZEPATIDE 7.5 MG/0.5ML ~~LOC~~ SOAJ: 7.5000 mg | SUBCUTANEOUS | Status: DC

## 2023-06-12 MED ORDER — THROMBIN 5000 UNITS EX SOLR
CUTANEOUS | Status: DC | PRN
  Administered 2023-06-12 (×2): 5000 [IU] via TOPICAL

## 2023-06-12 MED ORDER — ROCURONIUM BROMIDE 10 MG/ML (PF) SYRINGE
PREFILLED_SYRINGE | INTRAVENOUS | Status: AC
  Filled 2023-06-12: qty 10

## 2023-06-12 MED ORDER — THROMBIN 5000 UNITS EX SOLR: OROMUCOSAL | Status: DC | PRN

## 2023-06-12 MED ORDER — ALBUTEROL SULFATE (2.5 MG/3ML) 0.083% IN NEBU: 3.0000 mL | INHALATION_SOLUTION | Freq: Four times a day (QID) | RESPIRATORY_TRACT | Status: DC | PRN

## 2023-06-12 MED ORDER — DEXAMETHASONE SODIUM PHOSPHATE 10 MG/ML IJ SOLN
INTRAMUSCULAR | Status: DC | PRN
  Administered 2023-06-12: 10 mg via INTRAVENOUS

## 2023-06-12 MED ORDER — SODIUM CHLORIDE 0.9 % IV SOLN
250.0000 mL | INTRAVENOUS | Status: DC
  Administered 2023-06-12: 250 mL via INTRAVENOUS

## 2023-06-12 MED ORDER — ROCURONIUM BROMIDE 10 MG/ML (PF) SYRINGE
PREFILLED_SYRINGE | INTRAVENOUS | Status: DC | PRN
  Administered 2023-06-12: 20 mg via INTRAVENOUS
  Administered 2023-06-12: 60 mg via INTRAVENOUS
  Administered 2023-06-12: 20 mg via INTRAVENOUS
  Administered 2023-06-12: 30 mg via INTRAVENOUS

## 2023-06-12 MED ORDER — AMISULPRIDE (ANTIEMETIC) 5 MG/2ML IV SOLN: 10.0000 mg | Freq: Once | INTRAVENOUS | Status: DC | PRN

## 2023-06-12 MED ORDER — HYDROCODONE-ACETAMINOPHEN 5-325 MG PO TABS
2.0000 | ORAL_TABLET | ORAL | Status: DC | PRN
  Administered 2023-06-12: 2 via ORAL
  Filled 2023-06-12: qty 2

## 2023-06-12 MED ORDER — FENTANYL CITRATE (PF) 250 MCG/5ML IJ SOLN
INTRAMUSCULAR | Status: AC
  Filled 2023-06-12: qty 5

## 2023-06-12 MED ORDER — ONDANSETRON HCL 4 MG PO TABS: 4.0000 mg | ORAL_TABLET | Freq: Four times a day (QID) | ORAL | Status: DC | PRN

## 2023-06-12 MED ORDER — LACTATED RINGERS IV SOLN: INTRAVENOUS | Status: DC | PRN

## 2023-06-12 MED ORDER — CYCLOBENZAPRINE HCL 10 MG PO TABS: 10.0000 mg | ORAL_TABLET | Freq: Three times a day (TID) | ORAL | 0 refills | Status: AC | PRN

## 2023-06-12 MED ORDER — DIAZEPAM 5 MG PO TABS: 5.0000 mg | ORAL_TABLET | Freq: Three times a day (TID) | ORAL | Status: DC | PRN

## 2023-06-12 MED ORDER — PROPOFOL 10 MG/ML IV BOLUS
INTRAVENOUS | Status: AC
  Filled 2023-06-12: qty 20

## 2023-06-12 MED ORDER — HYDROMORPHONE HCL 1 MG/ML IJ SOLN: 0.5000 mg | INTRAMUSCULAR | Status: DC | PRN

## 2023-06-12 MED ORDER — CEFAZOLIN SODIUM-DEXTROSE 2-3 GM-%(50ML) IV SOLR
INTRAVENOUS | Status: DC | PRN
  Administered 2023-06-12: 3 g via INTRAVENOUS

## 2023-06-12 MED ORDER — ACETAMINOPHEN 500 MG PO TABS
1000.0000 mg | ORAL_TABLET | Freq: Once | ORAL | Status: AC
  Administered 2023-06-12: 1000 mg via ORAL
  Filled 2023-06-12: qty 2

## 2023-06-12 MED ORDER — ALUM & MAG HYDROXIDE-SIMETH 200-200-20 MG/5ML PO SUSP: 30.0000 mL | Freq: Four times a day (QID) | ORAL | Status: DC | PRN

## 2023-06-12 MED ORDER — LIDOCAINE 2% (20 MG/ML) 5 ML SYRINGE
INTRAMUSCULAR | Status: DC | PRN
  Administered 2023-06-12: 60 mg via INTRAVENOUS

## 2023-06-12 MED ORDER — SERTRALINE HCL 100 MG PO TABS
100.0000 mg | ORAL_TABLET | Freq: Every day | ORAL | Status: DC
  Administered 2023-06-12: 100 mg via ORAL
  Filled 2023-06-12: qty 1

## 2023-06-12 MED ORDER — ACETAMINOPHEN 650 MG RE SUPP: 650.0000 mg | RECTAL | Status: DC | PRN

## 2023-06-12 MED ORDER — CEFAZOLIN SODIUM-DEXTROSE 3-4 GM/150ML-% IV SOLN: 3.0000 g | INTRAVENOUS | Status: DC

## 2023-06-12 MED ORDER — PROPOFOL 10 MG/ML IV BOLUS
INTRAVENOUS | Status: DC | PRN
  Administered 2023-06-12: 200 mg via INTRAVENOUS

## 2023-06-12 MED ORDER — MENTHOL 3 MG MT LOZG: 1.0000 | LOZENGE | OROMUCOSAL | Status: DC | PRN

## 2023-06-12 MED ORDER — DEXAMETHASONE SODIUM PHOSPHATE 10 MG/ML IJ SOLN
INTRAMUSCULAR | Status: AC
  Filled 2023-06-12: qty 1

## 2023-06-12 MED ORDER — FENTANYL CITRATE (PF) 100 MCG/2ML IJ SOLN: 25.0000 ug | INTRAMUSCULAR | Status: DC | PRN

## 2023-06-12 MED ORDER — PANTOPRAZOLE SODIUM 40 MG IV SOLR: 40.0000 mg | Freq: Every day | INTRAVENOUS | Status: DC

## 2023-06-12 MED ORDER — ACETAMINOPHEN 325 MG PO TABS: 650.0000 mg | ORAL_TABLET | ORAL | Status: DC | PRN

## 2023-06-12 MED ORDER — CEFAZOLIN SODIUM-DEXTROSE 2-4 GM/100ML-% IV SOLN
2.0000 g | Freq: Three times a day (TID) | INTRAVENOUS | Status: DC
  Administered 2023-06-12: 2 g via INTRAVENOUS
  Filled 2023-06-12: qty 100

## 2023-06-12 MED ORDER — HYDROCODONE-ACETAMINOPHEN 5-325 MG PO TABS: 2.0000 | ORAL_TABLET | ORAL | 0 refills | Status: AC | PRN

## 2023-06-12 MED ORDER — ONDANSETRON HCL 4 MG/2ML IJ SOLN
INTRAMUSCULAR | Status: AC
  Filled 2023-06-12: qty 2

## 2023-06-12 MED ORDER — CYCLOBENZAPRINE HCL 10 MG PO TABS: 10.0000 mg | ORAL_TABLET | Freq: Three times a day (TID) | ORAL | Status: DC | PRN

## 2023-06-12 MED ORDER — SODIUM CHLORIDE 0.9% FLUSH: 3.0000 mL | INTRAVENOUS | Status: DC | PRN

## 2023-06-12 MED ORDER — CEFAZOLIN SODIUM 1 G IJ SOLR
INTRAMUSCULAR | Status: AC
  Filled 2023-06-12: qty 30

## 2023-06-12 MED ORDER — MIDAZOLAM HCL 2 MG/2ML IJ SOLN
INTRAMUSCULAR | Status: AC
  Filled 2023-06-12: qty 2

## 2023-06-12 MED ORDER — SODIUM CHLORIDE 0.9% FLUSH
3.0000 mL | Freq: Two times a day (BID) | INTRAVENOUS | Status: DC
  Administered 2023-06-12: 3 mL via INTRAVENOUS

## 2023-06-12 MED ORDER — PHENOL 1.4 % MT LIQD: 1.0000 | OROMUCOSAL | Status: DC | PRN

## 2023-06-12 MED ORDER — ORAL CARE MOUTH RINSE: 15.0000 mL | Freq: Once | OROMUCOSAL | Status: AC

## 2023-06-12 MED ORDER — ONDANSETRON HCL 4 MG/2ML IJ SOLN
INTRAMUSCULAR | Status: DC | PRN
  Administered 2023-06-12: 4 mg via INTRAVENOUS

## 2023-06-12 MED ORDER — CHILDRENS CHEW MULTIVITAMIN PO CHEW
1.0000 | CHEWABLE_TABLET | Freq: Every day | ORAL | Status: DC
  Filled 2023-06-12: qty 1

## 2023-06-12 MED ORDER — LIDOCAINE 2% (20 MG/ML) 5 ML SYRINGE
INTRAMUSCULAR | Status: AC
  Filled 2023-06-12: qty 5

## 2023-06-12 SURGICAL SUPPLY — 53 items
BAG COUNTER SPONGE SURGICOUNT (BAG) ×1 IMPLANT
BAND RUBBER #18 3X1/16 STRL (MISCELLANEOUS) ×2 IMPLANT
BASKET BONE COLLECTION (BASKET) ×1 IMPLANT
BENZOIN TINCTURE PRP APPL 2/3 (GAUZE/BANDAGES/DRESSINGS) ×1 IMPLANT
BIT DRILL NEURO 2X3.1 SFT TUCH (MISCELLANEOUS) ×1 IMPLANT
BONE VIVIGEN FORMABLE 1.3CC (Bone Implant) ×1 IMPLANT
BUR MATCHSTICK NEURO 3.0 LAGG (BURR) ×1 IMPLANT
CANISTER SUCTION 3000ML PPV (SUCTIONS) ×1 IMPLANT
DERMABOND ADVANCED .7 DNX12 (GAUZE/BANDAGES/DRESSINGS) IMPLANT
DRAPE C-ARM 42X72 X-RAY (DRAPES) ×2 IMPLANT
DRAPE LAPAROTOMY 100X72 PEDS (DRAPES) ×1 IMPLANT
DRAPE MICROSCOPE SLANT 54X150 (MISCELLANEOUS) ×1 IMPLANT
DURAPREP 6ML APPLICATOR 50/CS (WOUND CARE) ×1 IMPLANT
ELECT COATED BLADE 2.86 ST (ELECTRODE) ×1 IMPLANT
ELECTRODE REM PT RTRN 9FT ADLT (ELECTROSURGICAL) ×1 IMPLANT
GAUZE 4X4 16PLY ~~LOC~~+RFID DBL (SPONGE) IMPLANT
GAUZE SPONGE 4X4 12PLY STRL (GAUZE/BANDAGES/DRESSINGS) ×1 IMPLANT
GLOVE BIO SURGEON STRL SZ7 (GLOVE) IMPLANT
GLOVE BIO SURGEON STRL SZ8 (GLOVE) ×1 IMPLANT
GLOVE BIOGEL PI IND STRL 7.0 (GLOVE) IMPLANT
GLOVE EXAM NITRILE XL STR (GLOVE) IMPLANT
GLOVE INDICATOR 8.5 STRL (GLOVE) ×2 IMPLANT
GOWN STRL REUS W/ TWL LRG LVL3 (GOWN DISPOSABLE) IMPLANT
GOWN STRL REUS W/ TWL XL LVL3 (GOWN DISPOSABLE) ×1 IMPLANT
GOWN STRL REUS W/TWL 2XL LVL3 (GOWN DISPOSABLE) IMPLANT
GRAFT BNE MATRIX VG FRMBL SM 1 (Bone Implant) IMPLANT
HALTER HD/CHIN CERV TRACTION D (MISCELLANEOUS) ×1 IMPLANT
HEMOSTAT POWDER KIT SURGIFOAM (HEMOSTASIS) ×1 IMPLANT
KIT BASIN OR (CUSTOM PROCEDURE TRAY) ×1 IMPLANT
KIT TURNOVER KIT B (KITS) ×1 IMPLANT
NDL SPNL 20GX3.5 QUINCKE YW (NEEDLE) ×1 IMPLANT
NEEDLE SPNL 20GX3.5 QUINCKE YW (NEEDLE) ×1 IMPLANT
NS IRRIG 1000ML POUR BTL (IV SOLUTION) ×1 IMPLANT
PACK LAMINECTOMY NEURO (CUSTOM PROCEDURE TRAY) ×1 IMPLANT
PAD ARMBOARD POSITIONER FOAM (MISCELLANEOUS) ×3 IMPLANT
PATTIES SURGICAL .5 X.5 (GAUZE/BANDAGES/DRESSINGS) ×1 IMPLANT
PATTIES SURGICAL 1X1 (DISPOSABLE) ×1 IMPLANT
PIN DISTRACTION 14MM (PIN) IMPLANT
PLATE ANT CERV XTEND 2 LV 30 (Plate) IMPLANT
SCREW VAR 4.2 XD SELF DRILL 14 (Screw) IMPLANT
SPACER HEDRON 14X16X6 0D (Spacer) IMPLANT
SPACER HEDRON 14X16X7 0D (Spacer) IMPLANT
SPONGE INTESTINAL PEANUT (DISPOSABLE) ×1 IMPLANT
SPONGE SURGIFOAM ABS GEL SZ50 (HEMOSTASIS) ×1 IMPLANT
STRIP CLOSURE SKIN 1/2X4 (GAUZE/BANDAGES/DRESSINGS) ×1 IMPLANT
SUT VIC AB 3-0 SH 8-18 (SUTURE) ×1 IMPLANT
SUT VIC AB 4-0 PS2 18 (SUTURE) ×1 IMPLANT
SYR 30ML SLIP (SYRINGE) ×1 IMPLANT
TAPE CLOTH 4X10 WHT NS (GAUZE/BANDAGES/DRESSINGS) ×1 IMPLANT
TIP KERRISON THIN FOOTPLATE 1M (MISCELLANEOUS) IMPLANT
TOWEL GREEN STERILE (TOWEL DISPOSABLE) ×1 IMPLANT
TOWEL GREEN STERILE FF (TOWEL DISPOSABLE) ×1 IMPLANT
WATER STERILE IRR 1000ML POUR (IV SOLUTION) ×1 IMPLANT

## 2023-06-12 NOTE — Progress Notes (Signed)
 Orthopedic Tech Progress Note Patient Details:  Mitchell Lewis [birth year] [id number]  Ortho Devices Type of Ortho Device: Soft collar Ortho Device/Splint Location: NECK Ortho Device/Splint Interventions: Ordered, Application, Adjustment   Post Interventions Patient Tolerated: Well Instructions Provided: Care of device  [name] 06/12/2023, 10:40 AM

## 2023-06-12 NOTE — Anesthesia Preprocedure Evaluation (Signed)
 Anesthesia Evaluation  Patient identified by MRN, date of birth, ID band Patient awake    Reviewed: Allergy & Precautions, NPO status , Patient's Chart, lab work & pertinent test results  Airway Mallampati: III  TM Distance: >3 FB Neck ROM: Full    Dental  (+) Dental Advisory Given   Pulmonary sleep apnea , COPD, former smoker   breath sounds clear to auscultation       Cardiovascular + DOE   Rhythm:Regular Rate:Normal     Neuro/Psych  Neuromuscular disease    GI/Hepatic negative GI ROS, Neg liver ROS,,,  Endo/Other  diabetes, Type 2  Class 3 obesity  Renal/GU negative Renal ROS     Musculoskeletal  (+) Arthritis ,    Abdominal   Peds  Hematology negative hematology ROS (+)   Anesthesia Other Findings   Reproductive/Obstetrics                             Anesthesia Physical Anesthesia Plan  ASA: 3  Anesthesia Plan: General   Post-op Pain Management: Tylenol  PO (pre-op)*   Induction: Intravenous  PONV Risk Score and Plan: 2 and Dexamethasone, Ondansetron , Treatment may vary due to age or medical condition and Midazolam   Airway Management Planned: Oral ETT and Video Laryngoscope Planned  Additional Equipment:   Intra-op Plan:   Post-operative Plan: Extubation in OR  Informed Consent: I have reviewed the patients History and Physical, chart, labs and discussed the procedure including the risks, benefits and alternatives for the proposed anesthesia with the patient or authorized representative who has indicated his/her understanding and acceptance.     Dental advisory given  Plan Discussed with: CRNA  Anesthesia Plan Comments:        Anesthesia Quick Evaluation

## 2023-06-12 NOTE — Plan of Care (Signed)

## 2023-06-12 NOTE — Evaluation (Signed)
 Occupational Therapy Evaluation Patient Details Name: Mitchell Lewis. MRN: [id number] DOB: 1960-11-26 Today's Date: 06/12/2023   History of Present Illness   Pt is a 63 y/o male presenting on 5/12 for same day ACDF C5-6, C6-7. PMH includes: arthritis, pre-diabetes, sleep apnea, L/R heart cath.     Clinical Impressions Patient admitted for above and presents with problem list below.  PTA pt was independent and working. Patient was educated on brace mgmt and wear schedule, cervical precautions, ADL compensatory techniques, AE/DME, mobility progression, safety and recommendations.  Today, pt demonstrated ability to complete transfers with modified independence, functional mobility with modified independence, and ADLs with modified independence after education of compensatory techniques.  At discharge, pt will have support from spouse as needed.  Based on performance today, no further OT needs identified.        If plan is discharge home, recommend the following:   Assistance with cooking/housework;Assist for transportation     Functional Status Assessment   Patient has not had a recent decline in their functional status     Equipment Recommendations   None recommended by OT     Recommendations for Other Services         Precautions/Restrictions   Precautions Precautions: Cervical Precaution Booklet Issued: Yes (comment) Recall of Precautions/Restrictions: Intact Required Braces or Orthoses: Cervical Brace Cervical Brace: Soft collar Restrictions Weight Bearing Restrictions Per Provider Order: No     Mobility Bed Mobility               General bed mobility comments: OOB in chair, able to voice technique without cueing    Transfers Overall transfer level: Modified independent                        Balance Overall balance assessment: Modified Independent                                         ADL either performed or  assessed with clinical judgement   ADL Overall ADL's : Modified independent                                       General ADL Comments: after education on compesnatory techniques due to cervical prec.  using figure 4 technique for LB ADls     Vision   Vision Assessment?: No apparent visual deficits     Perception         Praxis         Pertinent Vitals/Pain Pain Assessment Pain Assessment: Faces Faces Pain Scale: Hurts a little bit Pain Location: incisional, throat Pain Descriptors / Indicators: Discomfort, Operative site guarding Pain Intervention(s): Monitored during session, Limited activity within patient's tolerance, Repositioned     Extremity/Trunk Assessment Upper Extremity Assessment Upper Extremity Assessment: Overall WFL for tasks assessed (within cervical prec)   Lower Extremity Assessment Lower Extremity Assessment: Defer to PT evaluation   Cervical / Trunk Assessment Cervical / Trunk Assessment: Neck Surgery   Communication Communication Communication: No apparent difficulties   Cognition Arousal: Alert Behavior During Therapy: WFL for tasks assessed/performed Cognition: No apparent impairments                               Following  commands: Intact       Cueing  General Comments   Cueing Techniques: Verbal cues      Exercises     Shoulder Instructions      Home Living Family/patient expects to be discharged to:: Private residence Living Arrangements: Spouse/significant other Available Help at Discharge: Family Type of Home: House Home Access: Stairs to enter Secretary/administrator of Steps: 1   Home Layout: One level     Bathroom Shower/Tub: Chief Strategy Officer: Handicapped height     Home Equipment: None          Prior Functioning/Environment Prior Level of Function : Independent/Modified Independent;Driving;Working/employed                    OT Problem List:      OT Treatment/Interventions:        OT Goals(Current goals can be found in the care plan section)   Acute Rehab OT Goals Patient Stated Goal: home OT Goal Formulation: With patient   OT Frequency:       Co-evaluation              AM-PAC OT "6 Clicks" Daily Activity     Outcome Measure Help from another person eating meals?: None Help from another person taking care of personal grooming?: None Help from another person toileting, which includes using toliet, bedpan, or urinal?: None Help from another person bathing (including washing, rinsing, drying)?: None Help from another person to put on and taking off regular upper body clothing?: None Help from another person to put on and taking off regular lower body clothing?: None 6 Click Score: 24   End of Session Equipment Utilized During Treatment: Cervical collar Nurse Communication: Mobility status  Activity Tolerance: Patient tolerated treatment well Patient left: in chair;with call bell/phone within reach  OT Visit Diagnosis: Other abnormalities of gait and mobility (R26.89)                Time: 1610-9604 OT Time Calculation (min): 12 min Charges:  OT General Charges $OT Visit: 1 Visit OT Evaluation $OT Eval Low Complexity: 1 Low  Bary Boss, OT Acute Rehabilitation Services Office [phone] Secure Chat Preferred    Fredrich Jefferson 06/12/2023, 1:38 PM

## 2023-06-12 NOTE — H&P (Signed)
 Mitchell E Lean Jr. is an 63 y.o. male.   Chief Complaint: Neck and right greater than left arm pain numbness tingling in his hands HPI: 63 year old gentleman progressive worsening neck pain right greater than left arm pain with numbness tingling his hands weakness in his hands.  Workup revealed disc radiation C5-6 C6-7 with severe cord compression at those levels.  Due to the patient's progression of clinical syndrome imaging findings failed conservative treatment I recommended anterior cervical discectomies and fusion at those 2 levels.  I extensively reviewed the risks and benefits of the operation with the patient as well as perioperative course expectations of outcome and alternatives of surgery and he understands and agrees to proceed forward.  Past Medical History:  Diagnosis Date   Arthritis    Colon polyp    Complication of anesthesia    wakes up angry   Fatty liver    History of colon polyps 2018   Pre-diabetes    Prediabetes 09/15/2017   Sleep apnea    wears CPAP nightly   Subareolar mass of left breast 11/10/2020    Past Surgical History:  Procedure Laterality Date   COLONOSCOPY  04/27/2020   RIGHT/LEFT HEART CATH AND CORONARY ANGIOGRAPHY Bilateral 12/24/2019   Procedure: RIGHT/LEFT HEART CATH AND CORONARY ANGIOGRAPHY;  Surgeon: Sammy Crisp, MD;  Location: [facility] INVASIVE CV [facility];  Service: Cardiovascular;  Laterality: Bilateral;   TMJ ARTHROSCOPY  1993   VASECTOMY     WISDOM TOOTH EXTRACTION  03/17/2017    Family History  Problem Relation Age of Onset   Breast cancer Mother 35   Cancer Mother 52       Breast cancer with radition   Diabetes Mother    Cancer Father        pancreatic   Heart disease Father    Kidney failure Father    Cancer Paternal Grandmother        prostate   Prostate cancer Paternal Grandfather    Social History:  reports that he quit smoking about 10 years ago. His smoking use included cigarettes. He started smoking about 50 years ago. He  has a 40 pack-year smoking history. He has never used smokeless tobacco. He reports current alcohol use of about 1.0 standard drink of alcohol per week. He reports that he does not use drugs.  Allergies:  Allergies  Allergen Reactions   Other Shortness Of Breath and Itching    Chicken feathers   Sulfa Antibiotics Rash and Hives    Medications Prior to Admission  Medication Sig Dispense Refill   cyclobenzaprine  (FLEXERIL ) 5 MG tablet TAKE 1 TABLET BY MOUTH TWICE A DAY AS NEEDED 30 tablet 0   Pediatric Multivitamins-Iron (FLINTSTONES COMPLETE PO) Take 1 tablet by mouth daily.     sertraline  (ZOLOFT ) 100 MG tablet TAKE 1 TABLET BY MOUTH EVERY DAY 90 tablet 3   Accu-Chek Softclix Lancets lancets Use as instructed daily to check sugars. DX R73.03 100 each 12   albuterol  (VENTOLIN  HFA) 108 (90 Base) MCG/ACT inhaler Inhale 2 puffs into the lungs every 6 (six) hours as needed for wheezing or shortness of breath. (Patient not taking: Reported on 06/06/2023) 8 g 6   Blood Glucose Monitoring Suppl (ACCU-CHEK GUIDE ME) w/Device KIT USE DAILY AS NEEDED TO CHECK SUGAR 1 kit 0   diazepam  (VALIUM ) 5 MG tablet Take 1 tablet prior to MRI. May repeat once If needed (Patient not taking: Reported on 06/06/2023) 2 tablet 0   glucose blood (ACCU-CHEK GUIDE) test strip  Use as needed daily to check sugars. Dx R73.03 100 each 12   tirzepatide  (MOUNJARO ) 7.5 MG/0.5ML Pen Inject 7.5 mg into the skin once a week. 6 mL 1    No results found for this or any previous visit (from the past 48 hours). No results found.  Review of Systems  Musculoskeletal:  Positive for neck pain.  Neurological:  Positive for weakness and numbness.    Blood pressure (!) 153/99, pulse 72, temperature 98.3 F (36.8 C), temperature source Oral, resp. rate 16, height 5\' 8"  (1.727 m), weight 124.7 kg, SpO2 95%. Physical Exam HENT:     Head: Normocephalic.     Right Ear: Tympanic membrane normal.     Nose: Nose normal.     Mouth/Throat:      Mouth: Mucous membranes are moist.  Cardiovascular:     Rate and Rhythm: Normal rate.     Pulses: Normal pulses.  Pulmonary:     Effort: Pulmonary effort is normal.  Musculoskeletal:        General: Normal range of motion.     Cervical back: Normal range of motion.  Neurological:     Mental Status: He is alert.     Comments: Patient is awake and alert strength is 5 out of 5 deltoid, bicep, tricep, wrist flexion, wrist extension, hand intrinsics.      Assessment/Plan 63 year old presents for anterior cervical discectomy fusion C5-6 C6-7.  Ferris Hua, MD 06/12/2023, 7:21 AM

## 2023-06-12 NOTE — Op Note (Signed)
 Perioperative diagnosis: Cervical spondylitic myelopathy with radiculopathy from severe cervical stenosis C5-6 C6-7.  Postoperative diagnosis: Same.  Procedure: Anterior cervical discectomies and fusion utilizing the globus titanium cages packed with locally harvested autograft mixed with Vivigen and anterior cervical plating utilizing the globus extend plating system at C5-6 and C6-7.  Surgeon: Gearl Keens.  Assistant: Beula Brunswick.  Anesthesia: General.  EBL: Minimal.  HPI: 63 year old gentleman progressive worsening neck pain bilateral shoulder and arm pain workup revealed severe spondylosis stenosis cord compression C5-6 C6-7.  Due to the patient's progression of clinical syndrome imaging findings of a conservative treatment I recommend anterior cervical discectomies and fusion at those levels.  I extensively reviewed the risks and benefits of the operation with him as well as perioperative course expectations of outcome and alternatives to surgery and he understood and agreed to proceed forward.  Operative procedure: Patient was brought into the OR was induced under general anesthesia positioned supine the neck in slight extension 5 pounds of halter traction.  The right side of his neck was prepped and draped in routine sterile fashion.  A curvilinear incision was made just off the midline to the intraportal of the sternocleidomastoid and superficial impetiginous dissected out divided longitudinally.  The avascular plane between the sternomastoid and strap muscle was developed down to the prevertebral fascia and prevertebral fascia was dissected away with Kitners.  Interoperative x-ray confirmed identification appropriate level so annulotomy's were made with a 15 blade scalpel to mark that the space at C5-6 and C6-7 anterior ossified's were bitten off of the Leksell rongeur did both the space were drilled down after the longus close reflected laterally and self-retaining retractor was placed.   Under microscope lamination first working at C5-6 posterior osteophyte complexes were drilled posterior large ligament is identified removed in piecemeal fashion removed extensive mount of soft disc material that have migrated subligamentous displacing the spinal cord especially to the right and removed a lot of uncinate hypertrophy causing severe stenosis of the C6 nerve roots bilaterally.  After adequate decompression been achieved there is no further stenosis either foraminally or centrally I sized up a 6 mm titanium cage packed with locally harvested autograft next with Vivigen and set that aside and put Gelfoam and in the dead space while I worked at C6-7.  In a similar fashion pathology here was large posterior projecting spurs this was all removed in piecemeal fashion decompressing central canal both C6 nerve roots.  Posterior laminar line to ligament was also removed in piecemeal fashion to ensure confirmation of adequate decompression both centrally and foraminally.  I sized up a 7 mm cage inserted both cages 1 to 2 mm deep to the anterior to byline selected a 30 mm globus extend plate all screws with excellent purchase locking mechanisms were engaged the wounds and copiously irrigated meticulous hemostasis was maintained the wound was then closed with interrupted Vicryl and the distal and a running 4 subcuticular.  Dermabond benzoin Steri-Strips and a sterile dressing was applied patient to cover him in stable condition.  At the end the case all needle count sponge counts were correct.

## 2023-06-12 NOTE — Anesthesia Postprocedure Evaluation (Signed)
 Anesthesia Post Note  Patient: Mitchell Lewis.  Procedure(s) Performed: ANTERIOR CERVICAL DECOMPRESSION/DISCECTOMY FUSION 2 LEVELS (Neck)     Patient location during evaluation: PACU Anesthesia Type: General Level of consciousness: awake and alert Pain management: pain level controlled Vital Signs Assessment: post-procedure vital signs reviewed and stable Respiratory status: spontaneous breathing, nonlabored ventilation, respiratory function stable and patient connected to nasal cannula oxygen Cardiovascular status: blood pressure returned to baseline and stable Postop Assessment: no apparent nausea or vomiting Anesthetic complications: no  No notable events documented.  Last Vitals:  Vitals:   06/12/23 1115 06/12/23 1137  BP: 128/77 139/88  Pulse: 76 74  Resp: 13 18  Temp: 37.2 C 36.6 C  SpO2: 97% 93%    Last Pain:  Vitals:   06/12/23 1115  TempSrc:   PainSc: 0-No pain                 Melvenia Stabs

## 2023-06-12 NOTE — Transfer of Care (Signed)
 Immediate Anesthesia Transfer of Care Note  Patient: Mitchell Lewis.  Procedure(s) Performed: ANTERIOR CERVICAL DECOMPRESSION/DISCECTOMY FUSION 2 LEVELS (Neck)  Patient Location: PACU  Anesthesia Type:General  Level of Consciousness: awake, alert , and oriented  Airway & Oxygen Therapy: Patient Spontanous Breathing and Patient connected to nasal cannula oxygen  Post-op Assessment: Report given to RN and Post -op Vital signs reviewed and stable  Post vital signs: Reviewed and stable  Last Vitals:  Vitals Value Taken Time  BP 115/98 06/12/23 1001  Temp    Pulse 88 06/12/23 1003  Resp 14 06/12/23 1003  SpO2 95 % 06/12/23 1003  Vitals shown include unfiled device data.  Last Pain:  Vitals:   06/12/23 0621  TempSrc:   PainSc: 9       Patients Stated Pain Goal: 2 (06/12/23 1914)  Complications: No notable events documented.

## 2023-06-12 NOTE — Discharge Instructions (Signed)
  Wound Care Keep incision covered and dry until post op day 3. You may remove the Honeycomb dressing on post op day 3. Leave steri-strips on back.  They will fall off by themselves. Do not put any creams, lotions, or ointments on incision. You are fine to shower. Let water run over incision and pat dry.  Activity Walk each and every day, increasing distance each day. No lifting greater than 8 lbs.  Avoid excessive neck motion. No driving, or riding a car unless coming back and forth to see the doctor  Diet Resume your normal diet.   Return to Work Will be discussed at your follow up appointment.  Call Your Doctor If Any of These Occur Redness, drainage, or swelling at the wound.  Temperature greater than 101 degrees. Severe pain not relieved by pain medication. Incision starts to come apart.  Follow Up Appt Call [phone] if you have one or any problem.

## 2023-06-12 NOTE — Progress Notes (Signed)
 Patient alert and oriented, mae's well, voiding adequate amount of urine, swallowing without difficulty, no c/o pain at time of discharge. Patient discharged home with family. Script and discharged instructions given to patient. Patient and family stated understanding of instructions given. Patient has an appointment with Dr. Wynetta Emery

## 2023-06-12 NOTE — Anesthesia Procedure Notes (Signed)
 Procedure Name: Intubation Date/Time: 06/12/2023 7:42 AM  Performed by: Artemisa Bile D, CRNAPre-anesthesia Checklist: Patient identified, Emergency Drugs available, Suction available and Patient being monitored Patient Re-evaluated:Patient Re-evaluated prior to induction Oxygen Delivery Method: Circle System Utilized Preoxygenation: Pre-oxygenation with 100% oxygen Induction Type: IV induction Ventilation: Mask ventilation without difficulty and Oral airway inserted - appropriate to patient size Laryngoscope Size: Glidescope and 4 Grade View: Grade I Tube type: Oral Tube size: 8.0 mm Number of attempts: 1 Airway Equipment and Method: Stylet and Oral airway Placement Confirmation: ETT inserted through vocal cords under direct vision, positive ETCO2 and breath sounds checked- equal and bilateral Secured at: 22 cm Tube secured with: Tape Dental Injury: Teeth and Oropharynx as per pre-operative assessment

## 2023-06-12 NOTE — Plan of Care (Signed)
 Problem: Education: Goal: Knowledge of General Education information will improve Description: Including pain rating scale, medication(s)/side effects and non-pharmacologic comfort measures 06/12/2023 1609 by Gregor Learned, RN Outcome: Completed/Met 06/12/2023 1222 by Gregor Learned, RN Outcome: Progressing   Problem: Health Behavior/Discharge Planning: Goal: Ability to manage health-related needs will improve 06/12/2023 1609 by Gregor Learned, RN Outcome: Completed/Met 06/12/2023 1222 by Gregor Learned, RN Outcome: Progressing   Problem: Clinical Measurements: Goal: Ability to maintain clinical measurements within normal limits will improve 06/12/2023 1609 by Gregor Learned, RN Outcome: Completed/Met 06/12/2023 1222 by Gregor Learned, RN Outcome: Progressing Goal: Will remain free from infection 06/12/2023 1609 by Gregor Learned, RN Outcome: Completed/Met 06/12/2023 1222 by Gregor Learned, RN Outcome: Progressing Goal: Diagnostic test results will improve 06/12/2023 1609 by Gregor Learned, RN Outcome: Completed/Met 06/12/2023 1222 by Gregor Learned, RN Outcome: Progressing Goal: Respiratory complications will improve 06/12/2023 1609 by Gregor Learned, RN Outcome: Completed/Met 06/12/2023 1222 by Gregor Learned, RN Outcome: Progressing Goal: Cardiovascular complication will be avoided 06/12/2023 1609 by Gregor Learned, RN Outcome: Completed/Met 06/12/2023 1222 by Gregor Learned, RN Outcome: Progressing   Problem: Activity: Goal: Risk for activity intolerance will decrease 06/12/2023 1609 by Gregor Learned, RN Outcome: Completed/Met 06/12/2023 1222 by Gregor Learned, RN Outcome: Progressing   Problem: Nutrition: Goal: Adequate nutrition will be maintained 06/12/2023 1609 by Gregor Learned, RN Outcome: Completed/Met 06/12/2023 1222 by Gregor Learned, RN Outcome: Progressing   Problem: Coping: Goal: Level of anxiety will decrease 06/12/2023  1609 by Gregor Learned, RN Outcome: Completed/Met 06/12/2023 1222 by Gregor Learned, RN Outcome: Progressing   Problem: Elimination: Goal: Will not experience complications related to bowel motility 06/12/2023 1609 by Gregor Learned, RN Outcome: Completed/Met 06/12/2023 1222 by Gregor Learned, RN Outcome: Progressing Goal: Will not experience complications related to urinary retention 06/12/2023 1609 by Gregor Learned, RN Outcome: Completed/Met 06/12/2023 1222 by Gregor Learned, RN Outcome: Progressing   Problem: Pain Managment: Goal: General experience of comfort will improve and/or be controlled 06/12/2023 1609 by Gregor Learned, RN Outcome: Completed/Met 06/12/2023 1222 by Gregor Learned, RN Outcome: Progressing   Problem: Safety: Goal: Ability to remain free from injury will improve 06/12/2023 1609 by Gregor Learned, RN Outcome: Completed/Met 06/12/2023 1222 by Gregor Learned, RN Outcome: Progressing   Problem: Skin Integrity: Goal: Risk for impaired skin integrity will decrease 06/12/2023 1609 by Gregor Learned, RN Outcome: Completed/Met 06/12/2023 1222 by Gregor Learned, RN Outcome: Progressing   Problem: Education: Goal: Ability to verbalize activity precautions or restrictions will improve 06/12/2023 1609 by Gregor Learned, RN Outcome: Completed/Met 06/12/2023 1222 by Gregor Learned, RN Outcome: Progressing Goal: Knowledge of the prescribed therapeutic regimen will improve 06/12/2023 1609 by Gregor Learned, RN Outcome: Completed/Met 06/12/2023 1222 by Gregor Learned, RN Outcome: Progressing Goal: Understanding of discharge needs will improve 06/12/2023 1609 by Gregor Learned, RN Outcome: Completed/Met 06/12/2023 1222 by Gregor Learned, RN Outcome: Progressing   Problem: Activity: Goal: Ability to avoid complications of mobility impairment will improve 06/12/2023 1609 by Gregor Learned, RN Outcome: Completed/Met 06/12/2023 1222 by  Gregor Learned, RN Outcome: Progressing Goal: Ability to tolerate increased activity will improve 06/12/2023 1609 by Gregor Learned, RN Outcome: Completed/Met 06/12/2023 1222 by Gregor Learned, RN Outcome: Progressing Goal: Will remain free from falls 06/12/2023 1609 by Gregor Learned, RN Outcome: Completed/Met 06/12/2023 1222 by Gregor Learned, RN Outcome: Progressing   Problem: Bowel/Gastric: Goal: Gastrointestinal status for postoperative course will improve 06/12/2023 1609 by Gregor Learned, RN Outcome: Completed/Met 06/12/2023 1222 by Gregor Learned, RN Outcome: Progressing   Problem: Clinical Measurements: Goal: Ability to  maintain clinical measurements within normal limits will improve 06/12/2023 1609 by Gregor Learned, RN Outcome: Completed/Met 06/12/2023 1222 by Gregor Learned, RN Outcome: Progressing Goal: Postoperative complications will be avoided or minimized 06/12/2023 1609 by Gregor Learned, RN Outcome: Completed/Met 06/12/2023 1222 by Gregor Learned, RN Outcome: Progressing Goal: Diagnostic test results will improve 06/12/2023 1609 by Gregor Learned, RN Outcome: Completed/Met 06/12/2023 1222 by Gregor Learned, RN Outcome: Progressing   Problem: Pain Management: Goal: Pain level will decrease 06/12/2023 1609 by Gregor Learned, RN Outcome: Completed/Met 06/12/2023 1222 by Gregor Learned, RN Outcome: Progressing   Problem: Skin Integrity: Goal: Will show signs of wound healing 06/12/2023 1609 by Gregor Learned, RN Outcome: Completed/Met 06/12/2023 1222 by Gregor Learned, RN Outcome: Progressing   Problem: Health Behavior/Discharge Planning: Goal: Identification of resources available to assist in meeting health care needs will improve 06/12/2023 1609 by Gregor Learned, RN Outcome: Completed/Met 06/12/2023 1222 by Gregor Learned, RN Outcome: Progressing   Problem: Bladder/Genitourinary: Goal: Urinary functional status for  postoperative course will improve 06/12/2023 1609 by Gregor Learned, RN Outcome: Completed/Met 06/12/2023 1222 by Gregor Learned, RN Outcome: Progressing

## 2023-06-12 NOTE — Discharge Summary (Signed)
  Physician Discharge Summary  Patient ID: Mitchell E Drach Jr. MRN: [id number] DOB/AGE: 04/26/1960 63 y.o. Estimated body mass index is 41.81 kg/m as calculated from the following:   Height as of this encounter: 5\' 8"  (1.727 m).   Weight as of this encounter: 124.7 kg.   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  Consults: Significant Diagnostic Studies: Treatments: ACDF C5-6 C6-7 Discharge Exam: Blood pressure 139/88, pulse 74, temperature 97.8 F (36.6 C), resp. rate 18, height 5\' 8"  (1.727 m), weight 124.7 kg, SpO2 93%. 5 out of 5, clean dry and intact  Disposition: Home   Allergies as of 06/12/2023       Reactions   Other Shortness Of Breath, Itching   Chicken feathers   Sulfa Antibiotics Rash, Hives        Medication List     TAKE these medications    Accu-Chek Guide Me w/Device Kit USE DAILY AS NEEDED TO CHECK SUGAR   Accu-Chek Guide test strip Generic drug: glucose blood Use as needed daily to check sugars. Dx R73.03   Accu-Chek Softclix Lancets lancets Use as instructed daily to check sugars. DX R73.03   albuterol  108 (90 Base) MCG/ACT inhaler Commonly known as: VENTOLIN  HFA Inhale 2 puffs into the lungs every 6 (six) hours as needed for wheezing or shortness of breath.   cyclobenzaprine  5 MG tablet Commonly known as: FLEXERIL  TAKE 1 TABLET BY MOUTH TWICE A DAY AS NEEDED What changed: Another medication with the same name was added. Make sure you understand how and when to take each.    cyclobenzaprine  10 MG tablet Commonly known as: FLEXERIL  Take 1 tablet (10 mg total) by mouth 3 (three) times daily as needed for muscle spasms. What changed: You were already taking a medication with the same name, and this prescription was added. Make sure you understand how and when to take each.   diazepam  5 MG tablet Commonly known as: VALIUM  Take 1 tablet prior to MRI. May repeat once If needed   FLINTSTONES COMPLETE PO Take 1 tablet by mouth daily.   HYDROcodone-acetaminophen  5-325 MG tablet Commonly known as: NORCO/VICODIN Take 2 tablets by mouth every 4 (four) hours as needed for severe pain (pain score 7-10).   Mounjaro  7.5 MG/0.5ML Pen Generic drug: tirzepatide  Inject 7.5 mg into the skin once a week.   sertraline  100 MG tablet Commonly known as: ZOLOFT  TAKE 1 TABLET BY MOUTH EVERY DAY         Signed: Ferris Hua 06/12/2023, 12:10 PM

## 2023-06-13 ENCOUNTER — Encounter (HOSPITAL_COMMUNITY): Payer: Self-pay | Admitting: Neurosurgery

## 2023-06-19 ENCOUNTER — Other Ambulatory Visit: Payer: Self-pay | Admitting: Nurse Practitioner

## 2023-06-19 DIAGNOSIS — E1165 Type 2 diabetes mellitus with hyperglycemia: Secondary | ICD-10-CM

## 2023-09-07 ENCOUNTER — Encounter: Payer: Self-pay | Admitting: Nurse Practitioner

## 2023-09-08 MED ORDER — MOUNJARO 7.5 MG/0.5ML ~~LOC~~ SOAJ
7.5000 mg | SUBCUTANEOUS | 0 refills | Status: AC
Start: 2023-09-08 — End: ?

## 2023-09-11 ENCOUNTER — Other Ambulatory Visit: Payer: Self-pay | Admitting: Nurse Practitioner

## 2023-09-12 ENCOUNTER — Telehealth: Payer: Self-pay

## 2023-09-12 ENCOUNTER — Other Ambulatory Visit: Payer: Self-pay | Admitting: Nurse Practitioner

## 2023-09-12 NOTE — Telephone Encounter (Signed)
 LAST APPOINTMENT DATE: 02/20/2023 NEXT APPOINTMENT DATE: Visit date not found  Accu-Chek Lancets and test strips   LAST REFILL: 04/03/2020 by Dr. Cleatus   QTY: 100 lancets each, 100 test strips each ; 12RF

## 2023-09-13 ENCOUNTER — Telehealth: Payer: Self-pay | Admitting: Pharmacy Technician

## 2023-09-13 ENCOUNTER — Other Ambulatory Visit (HOSPITAL_COMMUNITY): Payer: Self-pay

## 2023-09-13 MED ORDER — ACCU-CHEK SOFTCLIX LANCETS MISC: 12 refills | Status: AC

## 2023-09-13 MED ORDER — BLOOD GLUCOSE TEST VI STRP: 1.0000 | ORAL_STRIP | Freq: Every day | 12 refills | Status: AC

## 2023-09-13 NOTE — Telephone Encounter (Signed)
 Need pa for mounjaro 

## 2023-09-13 NOTE — Telephone Encounter (Signed)
 Pharmacy Patient Advocate Encounter   Received notification from Onbase that prior authorization for Mounjaro  7.5mg /0.43ml auto-injectors is required/requested.   Insurance verification completed.   The patient is insured through [facility] .   Per test claim: PA required; PA submitted to above mentioned insurance via Latent Key/confirmation #/EOC [facility] Status is pending

## 2023-09-13 NOTE — Addendum Note (Signed)
 Addended by: WENDEE LYNWOOD HERO on: 09/13/2023 01:12 PM   Modules accepted: Orders

## 2023-09-14 ENCOUNTER — Other Ambulatory Visit (HOSPITAL_COMMUNITY): Payer: Self-pay

## 2023-09-14 NOTE — Telephone Encounter (Signed)
 Pharmacy Patient Advocate Encounter  Received notification from [facility] that Prior Authorization for Mounjaro  7.5mg /0.77ml auto-injectors  has been DENIED.  Full denial letter will be uploaded to the media tab. See denial reason below.    PA #/Case ID/Reference #: [id number]

## 2023-09-14 NOTE — Telephone Encounter (Signed)
 Can we let the patient know that his insurance requires him to try and fail to other injectable drugs prior to covering mounjaro   I know he has been prescribed trulicity  and ozempic  in the past. Does he remember if he tolerated them or not?  RX team. This is a continuation of therapy. They are requiring him to swtich?

## 2023-09-14 NOTE — Telephone Encounter (Signed)
 Called pt. No answer and Unable to leave voicemail.

## 2023-09-18 ENCOUNTER — Other Ambulatory Visit (HOSPITAL_COMMUNITY): Payer: Self-pay

## 2023-09-18 NOTE — Telephone Encounter (Signed)
 PA request has been Approved. New Encounter has been or will be created for follow up. For additional info see Pharmacy Prior Auth telephone encounter from 09/13/2023.

## 2023-09-18 NOTE — Telephone Encounter (Signed)
-   Resubmission approval    Pharmacy Patient Advocate Encounter  Received notification from [facility] that Prior Authorization for Mounjaro  has been APPROVED from 09/16/23 to 09/15/24   PA #/Case ID/Reference #: [id number]

## 2023-09-18 NOTE — Telephone Encounter (Signed)
 New PA submitted with documentation of trial/failure Ozempic  and Trulicity .  Pharmacy Patient Advocate Encounter  Received notification from [facility] that Prior Authorization for Mounjaro  7.5MG /0.5ML auto-injectors  has been APPROVED from 09/16/2023 to 09/15/2024. Ran test claim, Copay is $200.00. This test claim was processed through [facility]- copay amounts may vary at other pharmacies due to pharmacy/plan contracts, or as the patient moves through the different stages of their insurance plan.   PA #/Case ID/Reference #: [id number]

## 2024-01-16 ENCOUNTER — Other Ambulatory Visit: Payer: Self-pay | Admitting: Nurse Practitioner

## 2024-01-16 NOTE — Telephone Encounter (Signed)
 Needs cpe scheduled within 30 days to continue getting refills

## 2024-01-26 NOTE — Telephone Encounter (Signed)
 Patient has not received his schedule for next month, he will call back and schedule once he has this.

## 2024-03-01 ENCOUNTER — Encounter: Payer: Self-pay | Admitting: *Deleted

## 2024-03-06 ENCOUNTER — Ambulatory Visit: Admitting: Rehabilitative and Restorative Service Providers"

## 2024-03-06 ENCOUNTER — Encounter: Payer: Self-pay | Admitting: Rehabilitative and Restorative Service Providers"

## 2024-03-06 ENCOUNTER — Ambulatory Visit: Admitting: Orthopedic Surgery

## 2024-03-06 ENCOUNTER — Ambulatory Visit: Payer: Self-pay

## 2024-03-06 DIAGNOSIS — R600 Localized edema: Secondary | ICD-10-CM | POA: Diagnosis not present

## 2024-03-06 DIAGNOSIS — M25642 Stiffness of left hand, not elsewhere classified: Secondary | ICD-10-CM | POA: Diagnosis not present

## 2024-03-06 DIAGNOSIS — M79642 Pain in left hand: Secondary | ICD-10-CM

## 2024-03-06 DIAGNOSIS — S63659A Sprain of metacarpophalangeal joint of unspecified finger, initial encounter: Secondary | ICD-10-CM | POA: Diagnosis not present

## 2024-03-06 DIAGNOSIS — M6281 Muscle weakness (generalized): Secondary | ICD-10-CM | POA: Diagnosis not present

## 2024-03-06 NOTE — Progress Notes (Signed)
 "    Mitchell Lewis. - 64 y.o. male MRN [id number]  Date of birth: 1960/11/03  Office Visit Note: Visit Date: 03/06/2024 PCP: Wendee Lynwood HERO, NP Referred by: Wendee Lynwood HERO, NP  Subjective: No chief complaint on file.  HPI: Mitchell E Farnan Jr. is a pleasant 64 y.o. male who presents today for evaluation of left hand long finger pain that has been present now for roughly 3 months after a dog bite injury.  Notable puncture wound to the MCP region of the long finger at that time.  Has not been seen or evaluated.  Bite region has healed well however he is having ongoing pain at the MCP region which has been persistent.  Also feels that he notices subluxation of the tendon occasionally on the extensor side.  Pertinent ROS were reviewed with the patient and found to be negative unless otherwise specified above in HPI.   Visit Reason: left hand/ left long finger pain Duration of symptoms: 3 months Hand dominance: right Occupation: insurance account manager  Diabetic: Yes- 5.9 Smoking: No- quit 10 years ago Heart/Lung History: Respiratory Severe obstructive sleep apnea-hypopnea syndrome Pulmonary nodule Centrilobular emphysema (HCC)   Blood Thinners: none  Prior Testing/EMG: none Injections (Date):none Treatments: none Prior Surgery: none  Assessment & Plan: Visit Diagnoses:  1. Pain in left hand   2. Sagittal band rupture at metacarpophalangeal joint, initial encounter     Plan: Based on examination today and correlating with his clinical history, I am concerned for a possible sagittal band injury to the radial aspect of the MCP region.  He does not have obvious dislocation of the extensor apparatus today on examination however there is mild subluxation in the ulnar direction with direct testing.  When placed into a relative motion posture of the long finger this helps improve his symptoms.  We discussed the underlying etiology and pathophysiology of his sagittal band injury.  Given  at this has not undergone any formalized treatment, I will have him be seen by occupational therapy today for fabrication of a relative motion orthosis for the long finger in order to allow for ongoing healing.  I recommended that he utilize the splint for the next 6 weeks full-time.  He can remove this for hygienic purposes as needed.  I will plan on seeing him back in approxi-6 weeks to track his progress with the splinting.  I did explain that should symptoms persist despite conservative treatments, we can consider potential sagittal band reconstruction in the future.  Patient expressed full understanding, will return in approximate 6 weeks.  OT will see him today.  Follow-up: No follow-ups on file.   Meds & Orders: No orders of the defined types were placed in this encounter.   Orders Placed This Encounter  Procedures   XR Hand Complete Left   Ambulatory referral to Occupational Therapy     Procedures: No procedures performed      Clinical History: INDICATION: Lumbago with sciatica, left side   COMPARISON: None.   TECHNIQUE: MRI SPINE LUMBAR WO IV CONTRAST.   FINDINGS:  #  Osseous structures: Vertebral body heights are maintained. No acute fracture. No pars defect. No destructive bony changes or concerning marrow signal abnormality.  #  Alignment:Grade 1 anterolisthesis L4-5.  #  Conus medullaris/cauda equina: Normal. Conus terminates at T12-L1.   #  Lower thoracic spine: No significant spinal canal or foraminal stenosis.   #  T12-L1: Mild DDD. Preservation of disc height. No focal disc herniation or  significant stenosis.  #  L1-L2: Mild DDD. Preservation of disc height. No focal disc herniation or significant stenosis.  #  L2-L3: Mild DDD and facet arthrosis. No preservation of disc height. No focal disc herniation or significant stenosis.  #  L3-L4: Mild DDD and facet arthrosis. Preservation of disc height. No focal disc herniation or significant stenosis.  #  L4-L5: Mild DDD  and moderate to severe facet arthrosis. Mild loss of disc height. Grade 1 anterolisthesis. Mild disc bulge and marginal spurring. Posterior hypertrophic changes. Moderate spinal canal and left greater than right lateral recess stenosis. Potential impingement of the traversing L5 nerve roots within the lateral recesses, particularly on the left. Moderate bilateral foraminal stenosis, right greater than left.  #  L5-S1: Mild DDD and moderate facet arthrosis. Preservation of disc height. Mild generalized disc osteophyte complex. No focal disc herniation or significant spinal canal stenosis. Mild bilateral foraminal stenosis.   #  Paraspinal tissues: Unremarkable   #  Contrast: None given.   #  Additional comments: None.  IMPRESSION:  1.  Multilevel DDD and facet arthrosis, more prominent lower lumbar spine. At L4-5, there is grade 1 degenerative spinal listhesis and prominent posterior hypertrophic changes resulting in moderate spinal canal and left greater than right lateral recess stenosis. Potential impingement of the traversing L5 nerve roots within the lateral recesses, particularly on the left. There is also moderate right greater than left foraminal stenosis at this level. Mild bilateral foraminal stenosis L5-S1.  2.  No acute fracture.   Electronically Signed by: Elsie Dayhoff on 11/01/2020 9:10 AM  He reports that he quit smoking about 10 years ago. His smoking use included cigarettes. He started smoking about 50 years ago. He has a 40 pack-year smoking history. He has never used smokeless tobacco. No results for input(s): HGBA1C, LABURIC in the last 8760 hours.  Objective:   Vital Signs: There were no vitals taken for this visit.  Physical Exam  Gen: Well-appearing, in no acute distress; non-toxic CV: Regular Rate. Well-perfused. Warm.  Resp: Breathing unlabored on room air; no wheezing. Psych: Fluid speech in conversation; appropriate affect; normal thought process  Ortho  Exam Left ankle - Well-healed puncture site of the dorsal aspect of the long finger at the MCP region - Able to perform composite fist of the long finger, notable tenderness over the radial aspect of the MCP region - With the wrist at flexion and extension, long finger able to perform appropriate extension, can both initiate extension and hold appropriately - When isolated, there is mild ulnar subluxation of the extensor apparatus of the long finger without frank dislocation - Hand remains warm well-perfused, sensation intact distally   Imaging: No results found.  Past Medical/Family/Surgical/Social History: Medications & Allergies reviewed per EMR, new medications updated. Patient Active Problem List   Diagnosis Date Noted   Spinal stenosis of cervical region 06/12/2023   Cervicogenic headache 02/20/2023   Former tobacco use 08/01/2022   Pulmonary nodule 12/27/2021   Centrilobular emphysema (HCC) 12/27/2021   Hepatic steatosis 07/23/2021   Other seborrheic keratosis 06/18/2021   History of malignant neoplasm of skin 06/18/2021   Melanocytic nevi of trunk 06/18/2021   Tinnitus of both ears 06/18/2021   Preventative health care 06/18/2021   Type 2 diabetes mellitus with hyperglycemia, without long-term current use of insulin (HCC) 12/01/2020   Gynecomastia 12/01/2020   Elevated LFTs 12/01/2020   Callus of foot 12/01/2020   Anxiety and depression 12/30/2019   DOE (dyspnea on exertion)  Abnormal stress test    Elevated lipids 11/08/2019   Chronic midline low back pain with left-sided sciatica 09/15/2017   Morbid obesity (HCC) 09/15/2017   Severe obstructive sleep apnea-hypopnea syndrome 09/15/2017   Hypogonadism in male 03/27/2017   Nocturia 03/27/2017   Past Medical History:  Diagnosis Date   Arthritis    Colon polyp    Complication of anesthesia    wakes up angry   Fatty liver    History of colon polyps 2018   Pre-diabetes    Prediabetes 09/15/2017   Sleep apnea     wears CPAP nightly   Subareolar mass of left breast 11/10/2020   Family History  Problem Relation Age of Onset   Breast cancer Mother 47   Cancer Mother 69       Breast cancer with radition   Diabetes Mother    Cancer Father        pancreatic   Heart disease Father    Kidney failure Father    Cancer Paternal Grandmother        prostate   Prostate cancer Paternal Grandfather    Past Surgical History:  Procedure Laterality Date   ANTERIOR CERVICAL DECOMP/DISCECTOMY FUSION N/A 06/12/2023   Procedure: ANTERIOR CERVICAL DECOMPRESSION/DISCECTOMY FUSION 2 LEVELS;  Surgeon: Onetha Kuba, MD;  Location: [facility] OR;  Service: Neurosurgery;  Laterality: N/A;  ACDF - C5-C6 - C6-C7   COLONOSCOPY  04/27/2020   RIGHT/LEFT HEART CATH AND CORONARY ANGIOGRAPHY Bilateral 12/24/2019   Procedure: RIGHT/LEFT HEART CATH AND CORONARY ANGIOGRAPHY;  Surgeon: Mady Bruckner, MD;  Location: [facility] INVASIVE CV LAB;  Service: Cardiovascular;  Laterality: Bilateral;   TMJ ARTHROSCOPY  1993   VASECTOMY     WISDOM TOOTH EXTRACTION  03/17/2017   Social History   Occupational History   Occupation: full time Super  Tobacco Use   Smoking status: Former    Current packs/day: 0.00    Average packs/day: 1 pack/day for 40.0 years (40.0 ttl pk-yrs)    Types: Cigarettes    Start date: 05/15/1973    Quit date: 05/15/2013    Years since quitting: 10.8   Smokeless tobacco: Never  Vaping Use   Vaping status: Never Used  Substance and Sexual Activity   Alcohol use: Yes    Alcohol/week: 1.0 standard drink of alcohol    Types: 1 Standard drinks or equivalent per week    Comment: occas   Drug use: No   Sexual activity: Not on file    Jerman Tinnon Estela) Arlinda, M.D.  [facility]  "

## 2024-03-06 NOTE — Therapy (Signed)
 " OUTPATIENT OCCUPATIONAL THERAPY ORTHO EVALUATION  Patient Name: Mitchell E Diffee Jr. MRN: [id number] DOB:[birth year], 64 y.o., male Today's Date: 03/06/2024  PCP: Wendee ALF NP REFERRING PROVIDER: Dr Arlinda   END OF SESSION:  OT End of Session - 03/06/24 1139     Visit Number 1    Number of Visits 1    OT Start Time 1139    OT Stop Time 1150    OT Time Calculation (min) 11 min    Equipment Utilized During Treatment orthotic materials    Activity Tolerance Patient tolerated treatment well;No increased pain;Patient limited by pain    Behavior During Therapy [facility] for tasks assessed/performed          Past Medical History:  Diagnosis Date   Arthritis    Colon polyp    Complication of anesthesia    wakes up angry   Fatty liver    History of colon polyps 2018   Pre-diabetes    Prediabetes 09/15/2017   Sleep apnea    wears CPAP nightly   Subareolar mass of left breast 11/10/2020   Past Surgical History:  Procedure Laterality Date   ANTERIOR CERVICAL DECOMP/DISCECTOMY FUSION N/A 06/12/2023   Procedure: ANTERIOR CERVICAL DECOMPRESSION/DISCECTOMY FUSION 2 LEVELS;  Surgeon: Onetha Kuba, MD;  Location: [facility] OR;  Service: Neurosurgery;  Laterality: N/A;  ACDF - C5-C6 - C6-C7   COLONOSCOPY  04/27/2020   RIGHT/LEFT HEART CATH AND CORONARY ANGIOGRAPHY Bilateral 12/24/2019   Procedure: RIGHT/LEFT HEART CATH AND CORONARY ANGIOGRAPHY;  Surgeon: Mady Bruckner, MD;  Location: [facility] INVASIVE CV LAB;  Service: Cardiovascular;  Laterality: Bilateral;   TMJ ARTHROSCOPY  1993   VASECTOMY     WISDOM TOOTH EXTRACTION  03/17/2017   Patient Active Problem List   Diagnosis Date Noted   Spinal stenosis of cervical region 06/12/2023   Cervicogenic headache 02/20/2023   Former tobacco use 08/01/2022   Pulmonary nodule 12/27/2021   Centrilobular emphysema (HCC) 12/27/2021   Hepatic steatosis 07/23/2021   Other seborrheic keratosis 06/18/2021   History of malignant neoplasm of skin 06/18/2021    Melanocytic nevi of trunk 06/18/2021   Tinnitus of both ears 06/18/2021   Preventative health care 06/18/2021   Type 2 diabetes mellitus with hyperglycemia, without long-term current use of insulin (HCC) 12/01/2020   Gynecomastia 12/01/2020   Elevated LFTs 12/01/2020   Callus of foot 12/01/2020   Anxiety and depression 12/30/2019   DOE (dyspnea on exertion)    Abnormal stress test    Elevated lipids 11/08/2019   Chronic midline low back pain with left-sided sciatica 09/15/2017   Morbid obesity (HCC) 09/15/2017   Severe obstructive sleep apnea-hypopnea syndrome 09/15/2017   Hypogonadism in male 03/27/2017   Nocturia 03/27/2017    ONSET DATE: Several months ago  REFERRING DIAG: M79.642 (ICD-10-CM) - Pain in left hand   THERAPY DIAG:  Localized edema  Muscle weakness (generalized)  Pain in left hand  Stiffness of left hand, not elsewhere classified  Rationale for Evaluation and Treatment: Rehabilitation  SUBJECTIVE:   SUBJECTIVE STATEMENT: He states that his dog attacked him.  This was months ago, and he seems to have a sagittal band injury.  The surgeon recommended conservative treatment with a relative motion orthosis and extension for the left middle finger.  The surgeon also feels that he should not need follow-up therapy at this point-rather just to use the orthosis for the next 6 to 8 weeks is much as possible.  The patient is in agreement with this plan and  arrives today for an orthosis and safety information.  He is with his spouse today    PERTINENT HISTORY: Past history of extensor tendon repair in the left hand  PRECAUTIONS: None  RED FLAGS: None   WEIGHT BEARING RESTRICTIONS: Yes nothing painful, and no strong gripping pushing or pulling recommended for 6 to 8 weeks with the left hand  PAIN:  Are you having pain? No  FALLS: Has patient fallen in last 6 months? No  PLOF: Independent  PATIENT GOALS: To obtain a relative motion orthosis to help support  his healing sagittal bands  NEXT MD VISIT: 6 to 8 weeks approximately  OBJECTIVE: (All objective assessments below are from initial evaluation on: 03/06/2024 unless otherwise specified.)    HAND DOMINANCE: Right   ADLs: Overall ADLs: States decreased ability to perform ADLs/IADLs due to left middle finger problems and pain at times   UPPER EXTREMITY ROM      Hand AROM Left eval  Full Fist Ability (or Gap to Distal Palmar Crease) Full fist ability limited by pain in the left middle finger, he is able to fully extend his hand with some mild irritation.  Thumb Opposition  (Kapandji Scale)  Within normal limits  (Blank rows = not tested)   UPPER EXTREMITY MMT:    Eval:  NT at eval due to recent and still healing injuries. Expected to improve with HEP and recommendations.   HAND FUNCTION: Eval: Observed weakness in affected left hand/arm, grossly 3-/5 MMT, but specific gripping and resistance training contraindicated today. Expected to improve with HEP and recommendations.    COORDINATION: Eval: Mild observed coordination impairments with affected left hand/arm. Expected to improve with HEP and recommendations.   SENSATION: Eval:  Light touch intact today  EDEMA:   Eval:  Mildly swollen in left hand today.  Expected to improve with HEP and recommendations.   COGNITION: Eval: Overall cognitive status: WFL for evaluation today   OBSERVATIONS:   Eval: Apparent sagittal band injury in the left middle finger ulnarly and possibly radially as well   TODAY'S TREATMENT:  Post-evaluation treatment:   Custom orthotic fabrication was indicated due to pt's left hand middle finger sagittal band injury and need for safe, functional positioning. OT fabricated custom relative motion extension orthosis for pt today to keep the middle finger elevated compared to the other digits. It fit well with no areas of pressure, pt states a comfortable fit. Pt was educated on the wearing schedule (on at  all times day and night as tolerated), to avoid exposing it to sources of heat, to wipe clean as needed (do not wash, use harsh detergents), to call or come in ASAP if it is causing any irritation or is not achieving desired function. Pt states understanding all directions.    The patient should also avoid any strong gripping, push, pull, weight bearing or repetitive motion for the next 6 to 8 weeks.  They should not be doing painful activities.  Sports and heavy weight lifting  should be withheld for a total of 3 months.     PATIENT EDUCATION: Education details: See tx section above for details  Person educated: Patient Education method: Verbal Instruction, Teach back, Handouts  Education comprehension: States and demonstrates understanding   HOME EXERCISE PROGRAM: See tx section above for details    GOALS: Goals reviewed with patient? Yes   SHORT TERM GOALS: (STG required if POC>30 days) Target Date: 03/06/2024  1.   Pt will obtain protective, custom orthotic to prevent  injury to surgical site and prevent soft-tissue contractures.  Goal status: MET     ASSESSMENT:  CLINICAL IMPRESSION: Patient is a 64 y.o. male who was seen today for occupational therapy evaluation for swelling, pain, weakness and decreased functional ability due to dog attack and sagittal band injury. The patient is appropriate for OT rehab services and benefited from treatment today. The patient got copious education/treatment today for self-care, orthotic wear and use and how to transition to normal activities in the next 6-8 weeks.  Of course he should follow-up with his surgeon as scheduled.  The patient agrees that they can manage these recommendations independently, and should not need to return for follow up visits. The patient should follow up with the surgeon with any concerns, and could possibly return to therapy, if needed, with a new order.  The patient will discharge therapy treatment after this visit.      PERFORMANCE DEFICITS: in functional skills including ADLs, IADLs, coordination, dexterity, sensation, edema, ROM, strength, pain, fascial restrictions, flexibility, Fine motor control, body mechanics, endurance, decreased knowledge of precautions, and UE functional use, cognitive skills including problem solving and safety awareness, and psychosocial skills including coping strategies, environmental adaptation, and habits.   IMPAIRMENTS: are limiting patient from ADLs, IADLs, rest and sleep, leisure, and social participation.   COMORBIDITIES: may have co-morbidities  that affects occupational performance. Patient will benefit from skilled OT to address above impairments and improve overall function.  MODIFICATION OR ASSISTANCE TO COMPLETE EVALUATION: No modification of tasks or assist necessary to complete an evaluation.  OT OCCUPATIONAL PROFILE AND HISTORY: Problem focused assessment: Including review of records relating to presenting problem.  CLINICAL DECISION MAKING: LOW - limited treatment options, no task modification necessary  REHAB POTENTIAL: Excellent  EVALUATION COMPLEXITY: Low      PLAN:  OT FREQUENCY: one time visit  OT DURATION: 1 sessions  PLANNED INTERVENTIONS: self care/ADL training, therapeutic exercise, therapeutic activity, neuromuscular re-education, manual therapy, scar mobilization, passive range of motion, splinting, ultrasound, fluidotherapy, compression bandaging, moist heat, cryotherapy, contrast bath, patient/family education, energy conservation, coping strategies training, and Re-evaluation  RECOMMENDED OTHER SERVICES: none now   CONSULTED AND AGREED WITH PLAN OF CARE: Patient  PLAN FOR NEXT SESSION:   N/A    Melvenia Ada, OTR/L, CHT 03/06/2024, 12:52 PM   "

## 2024-04-15 ENCOUNTER — Ambulatory Visit: Admitting: Orthopedic Surgery
